# Patient Record
Sex: Female | Born: 1995 | Hispanic: Yes | Marital: Single | Smoking: Former smoker
Health system: Southern US, Community
[De-identification: ages and names within clinical notes are randomized; demographics above are authoritative.]

---
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
## 2015-09-28 ENCOUNTER — Encounter (HOSPITAL_COMMUNITY): Payer: Self-pay | Admitting: Emergency Medicine

## 2015-09-28 ENCOUNTER — Emergency Department (HOSPITAL_COMMUNITY)
Admission: EM | Admit: 2015-09-28 | Discharge: 2015-09-28 | Disposition: A | Discharge status: Home / Self Care | Payer: BLUE CROSS/BLUE SHIELD | Attending: Emergency Medicine | Admitting: Emergency Medicine

## 2015-09-28 DIAGNOSIS — Y92410 Unspecified street and highway as the place of occurrence of the external cause: Secondary | ICD-10-CM | POA: Diagnosis not present

## 2015-09-28 DIAGNOSIS — S0993XA Unspecified injury of face, initial encounter: Secondary | ICD-10-CM | POA: Diagnosis present

## 2015-09-28 DIAGNOSIS — Y998 Other external cause status: Secondary | ICD-10-CM | POA: Insufficient documentation

## 2015-09-28 DIAGNOSIS — T148 Other injury of unspecified body region: Secondary | ICD-10-CM | POA: Diagnosis not present

## 2015-09-28 DIAGNOSIS — Y9389 Activity, other specified: Secondary | ICD-10-CM | POA: Diagnosis not present

## 2015-09-28 DIAGNOSIS — F17200 Nicotine dependence, unspecified, uncomplicated: Secondary | ICD-10-CM | POA: Insufficient documentation

## 2015-09-28 DIAGNOSIS — S4990XA Unspecified injury of shoulder and upper arm, unspecified arm, initial encounter: Secondary | ICD-10-CM | POA: Diagnosis not present

## 2015-09-28 DIAGNOSIS — S0083XA Contusion of other part of head, initial encounter: Secondary | ICD-10-CM | POA: Diagnosis not present

## 2015-09-28 DIAGNOSIS — T148XXA Other injury of unspecified body region, initial encounter: Secondary | ICD-10-CM

## 2015-09-28 MED ORDER — IBUPROFEN 200 MG PO TABS
400.0000 mg | ORAL_TABLET | Freq: Once | ORAL | Status: AC
  Administered 2015-09-28: 400 mg via ORAL
  Filled 2015-09-28: qty 2
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
## 2016-04-01 DIAGNOSIS — R0789 Other chest pain: Secondary | ICD-10-CM | POA: Diagnosis not present

## 2016-04-01 DIAGNOSIS — F419 Anxiety disorder, unspecified: Secondary | ICD-10-CM | POA: Insufficient documentation

## 2016-04-01 DIAGNOSIS — F17200 Nicotine dependence, unspecified, uncomplicated: Secondary | ICD-10-CM | POA: Insufficient documentation

## 2016-04-02 ENCOUNTER — Encounter (HOSPITAL_COMMUNITY): Payer: Self-pay | Admitting: *Deleted

## 2016-04-02 ENCOUNTER — Emergency Department (HOSPITAL_COMMUNITY)
Admission: EM | Admit: 2016-04-02 | Discharge: 2016-04-02 | Disposition: A | Discharge status: Home / Self Care | Payer: BLUE CROSS/BLUE SHIELD | Attending: Emergency Medicine | Admitting: Emergency Medicine

## 2016-04-02 ENCOUNTER — Emergency Department (HOSPITAL_COMMUNITY): Payer: BLUE CROSS/BLUE SHIELD

## 2016-04-02 DIAGNOSIS — R0789 Other chest pain: Secondary | ICD-10-CM

## 2016-04-02 DIAGNOSIS — F419 Anxiety disorder, unspecified: Secondary | ICD-10-CM

## 2016-04-02 MED ORDER — KETOROLAC TROMETHAMINE 30 MG/ML IJ SOLN
30.0000 mg | Freq: Once | INTRAMUSCULAR | Status: AC
  Administered 2016-04-02: 30 mg via INTRAMUSCULAR
  Filled 2016-04-02: qty 1

## 2016-04-02 NOTE — ED Notes (Signed)
The pt is c/o a headache chest pain for 2 days  lmp 2 days ago

## 2016-04-02 NOTE — ED Provider Notes (Signed)
CSN: [id number]     Arrival date & time 04/01/16  2353 History   First MD Initiated Contact with Patient 04/02/16 0210     Chief Complaint  Patient presents with  . Chest Pain     (Consider location/radiation/quality/duration/timing/severity/associated sxs/prior Treatment) HPI Comments: This a 20 year old female who presents with left-sided chest pain, anxiety and momentary syncopal episode earlier today.  She states that she's had frequent episodes of left-sided chest pain for the past year.  She's been evaluated several times with no specific findings and told that she's had costochondritis and treated with anti-inflammatories.  Today she states the pain is different and more intense.  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  She became weak coworker said she did not look well.  She went to the freezer.  She felt further warm.  She sat down and she may have had a momentary syncopal episode after which she proceeded to finish her shift.  Denied any nausea, vomiting.  She states that she finished her menstrual cycle.  2 days ago.  She does have very heavy cycles and has been known to be anemic in the past.  She's never needed blood transfusions.  Denies any nausea, vomiting, diarrhea, dysuria, vaginal discharge.  She has not taken any medication for her discomfort.  She says that there is a lot of "things going on in her life."  And she feels stressed  Patient is a 20 y.o. female presenting with chest pain. The history is provided by the patient.  Chest Pain Pain location:  L chest Pain quality: dull   Pain radiates to:  Does not radiate Pain radiates to the back: no   Pain severity:  Moderate Onset quality:  Unable to specify Timing:  Intermittent Progression:  Worsening Chronicity:  Chronic Context: at rest and stress   Context: not breathing, not eating and not lifting   Relieved by:  Nothing Worsened by:  Nothing  tried Associated symptoms: headache and shortness of breath   Associated symptoms: no dizziness, no fever, no nausea, no palpitations and not vomiting     History reviewed. No pertinent past medical history. History reviewed. No pertinent past surgical history. No family history on file. Social History  Substance Use Topics  . Smoking status: Current Every Day Smoker  . Smokeless tobacco: None  . Alcohol Use: Yes   OB History    No data available     Review of Systems  Constitutional: Negative for fever.  Respiratory: Positive for shortness of breath. Negative for wheezing and stridor.   Cardiovascular: Positive for chest pain. Negative for palpitations and leg swelling.  Gastrointestinal: Negative for nausea and vomiting.  Genitourinary: Negative for dysuria.  Musculoskeletal: Negative for myalgias.  Neurological: Positive for headaches. Negative for dizziness.  All other systems reviewed and are negative.     Allergies  Review of patient's allergies indicates no known allergies.  Home Medications   Prior to Admission medications   Medication Sig Start Date End Date Taking? Authorizing Provider  ibuprofen (ADVIL,MOTRIN) 800 MG tablet Take 1 tablet (800 mg total) by mouth 3 (three) times daily. Patient not taking: Reported on 12/24/2014 10/11/14   Arby Barrette, MD   BP 104/58 mmHg  Pulse 62  Temp(Src) 98.4 F (36.9 C) (Oral)  Resp 16  Ht  (1.651 m)  Wt 57.834 kg  BMI 21.22 kg/m2  SpO2 100%  LMP 03/31/2016 Physical Exam  Constitutional: She appears well-developed and well-nourished.  HENT:  Head: Normocephalic.  Neck: Normal range of motion.  Cardiovascular: Normal rate and regular rhythm.   Pulmonary/Chest: Effort normal and breath sounds normal.  Abdominal: Soft. She exhibits no distension.  Musculoskeletal: Normal range of motion. She exhibits no edema.  Neurological: She is alert.  Skin: Skin is warm.  Psychiatric: She has a normal mood and affect.   Nursing note and vitals reviewed.   ED Course  Procedures (including critical care time) Labs Review Labs Reviewed - No data to display  Imaging Review Dg Chest 2 View  04/02/2016  CLINICAL DATA:  Shortness of breath and chest pain left side for a few months, worse over 24 hours. EXAM: CHEST  2 VIEW COMPARISON:  12/24/2014 FINDINGS: The heart size and mediastinal contours are within normal limits. Both lungs are clear. The visualized skeletal structures are unremarkable. IMPRESSION: No active cardiopulmonary disease. Electronically Signed   By: Burman NievesWilliam  Stevens M.D.   On: 04/02/2016 01:13   I have personally reviewed and evaluated these images and lab results as part of my medical decision-making.   EKG Interpretation   Date/Time:  Saturday April 02 2016 00:21:52 EDT Ventricular Rate:  94 PR Interval:  112 QRS Duration: 80 QT Interval:  352 QTC Calculation: 440 R Axis:   76 Text Interpretation:  Normal sinus rhythm Interpretation limited secondary  to artifact no changes seen Confirmed by Erroll Lunani, Adeleke Ayokunle [phone]([id number]) on  04/02/2016 2:33:40 AM      MDM   Final diagnoses:  Anxiety  Left-sided chest wall pain        Earley FavorGail Kaenan Jake, NP 04/02/16 [id number]  Tomasita CrumbleAdeleke Oni, MD 04/02/16 [phone]

## 2016-04-02 NOTE — Discharge Instructions (Signed)
Chest Wall Pain °Chest wall pain is pain in or around the bones and muscles of your chest. Sometimes, an injury causes this pain. Sometimes, the cause may not be known. This pain may take several weeks or longer to get better. °HOME CARE °Pay attention to any changes in your symptoms. Take these actions to help with your pain: °· Rest as told by your doctor. °· Avoid activities that cause pain. Try not to use your chest, belly (abdominal), or side muscles to lift heavy things. °· If directed, apply ice to the painful area: °¨ Put ice in a plastic bag. °¨ Place a towel between your skin and the bag. °¨ Leave the ice on for 20 minutes, 2-3 times per day. °· Take over-the-counter and prescription medicines only as told by your doctor. °· Do not use tobacco products, including cigarettes, chewing tobacco, and e-cigarettes. If you need help quitting, ask your doctor. °· Keep all follow-up visits as told by your doctor. This is important. °GET HELP IF: °· You have a fever. °· Your chest pain gets worse. °· You have new symptoms. °GET HELP RIGHT AWAY IF: °· You feel sick to your stomach (nauseous) or you throw up (vomit). °· You feel sweaty or light-headed. °· You have a cough with phlegm (sputum) or you cough up blood. °· You are short of breath. °  °This information is not intended to replace advice given to you by your health care provider. Make sure you discuss any questions you have with your health care provider. °  °Document Released: 02/22/2008 Document Revised: 05/27/2015 Document Reviewed: 12/01/2014 °Elsevier Interactive Patient Education ©2016 Elsevier Inc. ° °

## 2016-05-22 ENCOUNTER — Emergency Department (HOSPITAL_COMMUNITY)
Admission: EM | Admit: 2016-05-22 | Discharge: 2016-05-22 | Disposition: A | Discharge status: Home / Self Care | Payer: BLUE CROSS/BLUE SHIELD | Attending: Emergency Medicine | Admitting: Emergency Medicine

## 2016-05-22 ENCOUNTER — Emergency Department (HOSPITAL_COMMUNITY): Payer: BLUE CROSS/BLUE SHIELD

## 2016-05-22 ENCOUNTER — Encounter (HOSPITAL_COMMUNITY): Payer: Self-pay

## 2016-05-22 DIAGNOSIS — M79645 Pain in left finger(s): Secondary | ICD-10-CM | POA: Diagnosis present

## 2016-05-22 DIAGNOSIS — Z79899 Other long term (current) drug therapy: Secondary | ICD-10-CM | POA: Insufficient documentation

## 2016-05-22 DIAGNOSIS — L03019 Cellulitis of unspecified finger: Secondary | ICD-10-CM

## 2016-05-22 DIAGNOSIS — F17200 Nicotine dependence, unspecified, uncomplicated: Secondary | ICD-10-CM | POA: Diagnosis not present

## 2016-05-22 DIAGNOSIS — Z23 Encounter for immunization: Secondary | ICD-10-CM | POA: Insufficient documentation

## 2016-05-22 DIAGNOSIS — L03012 Cellulitis of left finger: Secondary | ICD-10-CM | POA: Diagnosis not present

## 2016-05-22 MED ORDER — BUPIVACAINE HCL (PF) 0.5 % IJ SOLN
10.0000 mL | Freq: Once | INTRAMUSCULAR | Status: AC
  Administered 2016-05-22: 10 mL
  Filled 2016-05-22: qty 10

## 2016-05-22 MED ORDER — TETANUS-DIPHTH-ACELL PERTUSSIS 5-2.5-18.5 LF-MCG/0.5 IM SUSP
0.5000 mL | Freq: Once | INTRAMUSCULAR | Status: AC
  Administered 2016-05-22: 0.5 mL via INTRAMUSCULAR
  Filled 2016-05-22: qty 0.5

## 2016-05-22 MED ORDER — HYDROCODONE-ACETAMINOPHEN 5-325 MG PO TABS: 1.0000 | ORAL_TABLET | Freq: Four times a day (QID) | ORAL | 0 refills | Status: DC | PRN

## 2016-05-22 MED ORDER — LIDOCAINE HCL (PF) 1 % IJ SOLN
5.0000 mL | Freq: Once | INTRAMUSCULAR | Status: AC
  Administered 2016-05-22: 5 mL
  Filled 2016-05-22: qty 5

## 2016-05-22 MED ORDER — NAPROXEN 500 MG PO TABS: 500.0000 mg | ORAL_TABLET | Freq: Two times a day (BID) | ORAL | 0 refills | Status: DC

## 2016-05-22 MED ORDER — CEPHALEXIN 500 MG PO CAPS: 500.0000 mg | ORAL_CAPSULE | Freq: Four times a day (QID) | ORAL | 0 refills | Status: DC
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
## 2016-05-22 NOTE — Discharge Instructions (Signed)
You have been seen today for thumb pain. Your imaging showed no abnormalities. Her pain is likely due to infection under the skin, called a felon. It may stem from a lesser infection called a paronychia, which originates as an infection underneath the nail cuticle. These infections can arise from the use of artificial nails as well as from injuries to the cuticle.   Please take all of your antibiotics until finished!   You may develop abdominal discomfort or diarrhea from the antibiotic.  You may help offset this with probiotics which you can buy or get in yogurt. Do not eat or take the probiotics until 2 hours after your antibiotic.   Remove the bandage after 24 hours. You must wait at least 8 hours after the wound repair to wash the wound. Clean the wound and surrounding area gently with tap water and mild soap. Rinse well and blot dry. Do not scrub the wound, as this may cause the wound edges to come apart. You may shower, but avoid submerging the wound, such as with a bath or swimming. Clean the wound daily to prevent infection. Reapplication of a topical antibiotic ointment, such as Neosporin, will decrease scab formation and reduce any scarring. You may use Tylenol, naproxen, ibuprofen for pain.  Return to the ED in 3 days for suture removal.  Return to the ED sooner should signs of infection arise, such as spreading redness, puffiness/swelling, pus draining from the wound, severe increase in pain, or any other major issues.

## 2016-05-22 NOTE — ED Provider Notes (Signed)
[facility]-EMER[facility]Y DEPT Provider Note   CSN: [id number] Arrival date & time: 05/22/16  0940  By signing my name below, I, Javier Docker, attest that this documentation has been prepared under the direction and in the presence of Yarethzy Croak, PA-C. Electronically Signed: Javier Docker, ER Scribe. 04/30/2016. 10:11 AM.   History   Chief Complaint Chief Complaint  Patient presents with  . thumb pain   The history is provided by the patient. No language interpreter was used.     HPI Comments: Angeleen Horney is a 20 y.o. female who presents to the Emergency Department complaining of left thumb swelling and pain for the last three days. She is not aware of an initial injury. Has not taken anything for the pain prior to arrival. She states she did have acrylic nails placed about 2 weeks ago. She denies fever/chills, neuro deficits, or any other complaints.    History reviewed. No pertinent past medical history.  There are no active problems to display for this patient.   History reviewed. No pertinent surgical history.  OB History    No data available       Home Medications    Prior to Admission medications   Medication Sig Start Date End Date Taking? Authorizing Provider  cephALEXin (KEFLEX) 500 MG capsule Take 1 capsule (500 mg total) by mouth 4 (four) times daily. 05/22/16   Howie Rufus C Barrie Wale, PA-C  HYDROcodone-acetaminophen (NORCO/VICODIN) 5-325 MG tablet Take 1 tablet by mouth every 6 (six) hours as needed. 05/22/16   Leslee Suire C Johnnay Pleitez, PA-C  ibuprofen (ADVIL,MOTRIN) 800 MG tablet Take 1 tablet (800 mg total) by mouth 3 (three) times daily. Patient not taking: Reported on 12/24/2014 10/11/14   Arby Barrette, MD  naproxen (NAPROSYN) 500 MG tablet Take 1 tablet (500 mg total) by mouth 2 (two) times daily. 05/22/16   Anselm Pancoast, PA-C    Family History No family history on file.  Social History Social History  Substance Use Topics  . Smoking status: Current Every Day Smoker  . Smokeless  tobacco: Never Used  . Alcohol use Yes     Allergies   Review of patient's allergies indicates no known allergies.   Review of Systems Review of Systems  Constitutional: Negative for chills and fever.  Skin: Positive for color change. Negative for wound.  Neurological: Negative for weakness and numbness.     Physical Exam Updated Vital Signs BP 113/76   Pulse 95   Temp 98.7 F (37.1 C) (Oral)   Resp 18   SpO2 99%   Physical Exam  Constitutional: She appears well-developed and well-nourished. No distress.  HENT:  Head: Normocephalic and atraumatic.  Eyes: Conjunctivae are normal.  Neck: Neck supple.  Cardiovascular: Normal rate and regular rhythm.   Pulmonary/Chest: Effort normal.  Musculoskeletal:  Swelling, redness, and tenderness to the left thumb, especially proximal to the nail bed. ROM is intact.  Neurological: She is alert.  No sensory deficits. Strength 5 out of 5 in the left hand.  Skin: Skin is warm and dry. She is not diaphoretic.  Psychiatric: She has a normal mood and affect. Her behavior is normal.  Nursing note and vitals reviewed.    ED Treatments / Results  DIAGNOSTIC STUDIES: Oxygen Saturation is 99% on RA, normal by my interpretation.    COORDINATION OF CARE: 10:12 AM Discussed treatment plan with pt I&D and pt agreed to plan.   Labs (all labs ordered are listed, but only abnormal results are displayed)  Labs Reviewed - No data to display  EKG  EKG Interpretation None       Radiology Dg Finger Thumb Left  Result Date: 05/22/2016 CLINICAL DATA:  20 year old female with a history of left thumb edema EXAM: LEFT THUMB 2+V COMPARISON:  None. FINDINGS: No acute fracture identified. No radiopaque foreign body. Joints appear congruent. Questionable soft tissue swelling on the dorsal aspect of the distal thumb. There is a rounded small lucency on the lateral view of the thumb, dorsal soft tissues, subjacent to the fabricated finger nail. This  is not visualized on the other views. IMPRESSION: Negative for acute bony abnormality. Questionable soft tissue swelling on the dorsal soft tissues of the thumb. The lateral view demonstrates questionable small focus of gas underneath the fabricated fingernail. Uncertain if this would be related to application of the fabricated fingernail, or alternatively an infection. This should be amenable to direct inspection. Signed, Yvone NeuJaime S. Loreta AveWagner, DO [facility] Electronically Signed   By: Gilmer MorJaime  Wagner D.O.   On: 05/22/2016 10:57    Procedures .Marland Kitchen.Incision and Drainage Date/Time: 05/22/2016 11:55 AM Performed by: Anselm PancoastJOY, Amaad Byers C Authorized by: Anselm PancoastJOY, Desaray Marschner C   Consent:    Consent obtained:  Verbal   Consent given by:  Patient   Risks discussed:  Bleeding, infection, incomplete drainage and pain   Alternatives discussed:  No treatment, delayed treatment and alternative treatment Location:    Type:  Abscess   Location:  Upper extremity   Upper extremity location:  Finger   Finger location:  L thumb Pre-procedure details:    Skin preparation:  Betadine Anesthesia (see MAR for exact dosages):    Anesthesia method:  Nerve block   Block location:  Digital   Block needle gauge:  27 G   Block anesthetic:  Lidocaine 1% w/o epi and bupivacaine 0.5% w/o epi   Block injection procedure:  Anatomic landmarks identified, anatomic landmarks palpated, introduced needle, negative aspiration for blood and incremental injection   Block outcome:  Incomplete block Procedure type:    Complexity:  Simple Procedure details:    Incision types:  Single straight   Incision depth:  Subungual   Scalpel blade:  11   Wound management:  Irrigated with saline   Drainage:  Bloody and purulent   Drainage amount:  Moderate   Wound treatment:  Wound left open   Packing materials:  None Post-procedure details:    Patient tolerance of procedure:  Tolerated with  difficulty .Nail Removal Date/Time: 05/22/2016 12:55 PM Performed by: Anselm PancoastJOY, Raylynn Hersh C Authorized by: Harolyn RutherfordJOY, Verl Kitson C   Consent:    Consent obtained:  Verbal   Consent given by:  Patient   Risks discussed:  Bleeding, incomplete removal, infection, pain and permanent nail deformity   Alternatives discussed:  No treatment, alternative treatment and delayed treatment Location:    Hand:  L thumb Pre-procedure details:    Skin preparation:  Betadine   Preparation: Patient was prepped and draped in the usual sterile fashion   Anesthesia (see MAR for exact dosages):    Anesthesia method:  Nerve block   Block location:  Digital   Block needle gauge:  27 G   Block anesthetic:  Bupivacaine 0.5% w/o epi and lidocaine 1% w/o epi   Block injection procedure:  Anatomic landmarks identified, anatomic landmarks palpated, introduced needle, negative aspiration for blood and incremental injection   Block outcome:  Incomplete block Nail Removal:    Nail removed:  Complete  Removed nail replaced and anchored: yes     Stented with:  3-0 silk Post-procedure details:    Dressing:  4x4 sterile gauze and splint   Patient tolerance of procedure:  Tolerated with difficulty .Nerve Block Date/Time: 05/22/2016 11:30 AM Performed by: Anselm Pancoast Authorized by: Anselm Pancoast   Consent:    Consent obtained:  Verbal   Consent given by:  Patient   Risks discussed:  Allergic reaction, infection, nerve damage, swelling, unsuccessful block and pain   Alternatives discussed:  No treatment Indications:    Indications:  Procedural anesthesia Location:    Body area:  Upper extremity   Upper extremity nerve blocked: Digital.   Laterality:  Left Pre-procedure details:    Skin preparation:  2% chlorhexidine   Preparation: Patient was prepped and draped in usual sterile fashion   Procedure details (see MAR for exact dosages):    Block needle gauge:  27 G   Anesthetic injected:  Bupivacaine 0.5% w/o epi and lidocaine 1%  w/o epi   Injection procedure:  Anatomic landmarks identified, anatomic landmarks palpated, incremental injection, introduced needle and negative aspiration for blood Post-procedure details:    Outcome:  Anesthesia achieved   Patient tolerance of procedure:  Tolerated well, no immediate complications   (including critical care time)  Medications Ordered in ED Medications  Tdap (BOOSTRIX) injection 0.5 mL (0.5 mLs Intramuscular Given 05/22/16 1023)  bupivacaine (MARCAINE) 0.5 % injection 10 mL (10 mLs Infiltration Given by Other 05/22/16 1022)  lidocaine (PF) (XYLOCAINE) 1 % injection 5 mL (5 mLs Infiltration Given by Other 05/22/16 1022)     Initial Impression / Assessment and Plan / ED Course  I have reviewed the triage vital signs and the nursing notes.  Pertinent labs & imaging results that were available during my care of the patient were reviewed by me and considered in my medical decision making (see chart for details).  Clinical Course    Sierah Lacewell presents with paronychia that has likely progress to a felon. No signs of spreading infection into the hand. No signs of systemic infection. The initial I&D failed to produce access to the source of infection. The source of the infection was found to be subungual. The nail had to be removed to facilitate proper cleaning. Underlying nail matrix left intact. Nail was slightly reattached with silk suture. Patient to return in 3 days for wound check and suture removal. She will need to have an appropriate bandage placed at that time to continue to protect the nail matrix. The patient was given instructions for home care as well as return precautions. Patient voices understanding of these instructions, accepts the plan, and is comfortable with discharge.      Final Clinical Impressions(s) / ED Diagnoses   Final diagnoses:  Felon of finger    New Prescriptions Discharge Medication List as of 05/22/2016 12:57 PM    START taking these  medications   Details  cephALEXin (KEFLEX) 500 MG capsule Take 1 capsule (500 mg total) by mouth 4 (four) times daily., Starting Sun 05/22/2016, Print    HYDROcodone-acetaminophen (NORCO/VICODIN) 5-325 MG tablet Take 1 tablet by mouth every 6 (six) hours as needed., Starting Sun 05/22/2016, Print    naproxen (NAPROSYN) 500 MG tablet Take 1 tablet (500 mg total) by mouth 2 (two) times daily., Starting Sun 05/22/2016, Print          I personally performed the services described in this documentation, which was scribed in my presence. The recorded  information has been reviewed and is accurate.    Anselm Pancoast, PA-C 05/22/16 1352    Doug Sou, MD 05/22/16 1640

## 2016-05-26 ENCOUNTER — Emergency Department (HOSPITAL_COMMUNITY)
Admission: EM | Admit: 2016-05-26 | Discharge: 2016-05-27 | Disposition: A | Discharge status: Home / Self Care | Payer: BLUE CROSS/BLUE SHIELD | Attending: Emergency Medicine | Admitting: Emergency Medicine

## 2016-05-26 ENCOUNTER — Encounter (HOSPITAL_COMMUNITY): Payer: Self-pay | Admitting: Emergency Medicine

## 2016-05-26 DIAGNOSIS — Z87891 Personal history of nicotine dependence: Secondary | ICD-10-CM | POA: Insufficient documentation

## 2016-05-26 DIAGNOSIS — Z4801 Encounter for change or removal of surgical wound dressing: Secondary | ICD-10-CM | POA: Diagnosis not present

## 2016-05-26 DIAGNOSIS — Z4802 Encounter for removal of sutures: Secondary | ICD-10-CM | POA: Insufficient documentation

## 2016-05-26 DIAGNOSIS — Z5189 Encounter for other specified aftercare: Secondary | ICD-10-CM

## 2016-05-26 NOTE — ED Provider Notes (Signed)
[facility] Provider Note   CSN: [id number] Arrival date & time: 05/26/16  2052  By signing my name below, I, Emmanuella Mensah, attest that this documentation has been prepared under the direction and in the presence of Kerrie BuffaloHope Neese, NP. Electronically Signed: Angelene GiovanniEmmanuella Mensah, ED Scribe. 05/26/16. 12:05 AM.    History   Chief Complaint Chief Complaint  Patient presents with  . Suture / Staple Removal   HPI Comments: Sandra Vincent is a 20 y.o. female who presents to the Emergency Department requesting suture removal from left thumb. Pt was seen here on 05/22/16 for persistent left thumb pain and swelling where she had normal thumb x-ray and diagnosed with a Paronychia. She received an I&D with moderate amount of bloody and purulent drainage. She then received a nail removal with a nerve block. Pt was discharged with Keflex and her tetanus vaccine was updated at that time as well. She reports that she is here today because she was advised to follow up within 3 days for suture removal. No fever, chills, or generalized rash.   The history is provided by the patient. No language interpreter was used.    History reviewed. No pertinent past medical history.  There are no active problems to display for this patient.   History reviewed. No pertinent surgical history.  OB History    No data available       Home Medications    Prior to Admission medications   Medication Sig Start Date End Date Taking? Authorizing Provider  cephALEXin (KEFLEX) 500 MG capsule Take 1 capsule (500 mg total) by mouth 4 (four) times daily. 05/22/16   Shawn C Joy, PA-C  HYDROcodone-acetaminophen (NORCO/VICODIN) 5-325 MG tablet Take 1 tablet by mouth every 6 (six) hours as needed. 05/22/16   Shawn C Joy, PA-C  ibuprofen (ADVIL,MOTRIN) 800 MG tablet Take 1 tablet (800 mg total) by mouth 3 (three) times daily. Patient not taking: Reported on 12/24/2014 10/11/14   Arby BarretteMarcy Pfeiffer, MD  naproxen (NAPROSYN) 500 MG  tablet Take 1 tablet (500 mg total) by mouth 2 (two) times daily. 05/22/16   Anselm PancoastShawn C Joy, PA-C    Family History History reviewed. No pertinent family history.  Social History Social History  Substance Use Topics  . Smoking status: Former Games developermoker  . Smokeless tobacco: Never Used  . Alcohol use No     Allergies   Review of patient's allergies indicates no known allergies.   Review of Systems Review of Systems  Constitutional: Negative for chills and fever.  Skin: Positive for wound. Negative for rash.     Physical Exam Updated Vital Signs BP 104/75 (BP Location: Right Arm)   Pulse 72   Temp 98.3 F (36.8 C) (Oral)   Resp 20   Ht 5\' 5"  (1.651 m)   Wt 58.1 kg   LMP 04/30/2016   SpO2 100%   BMI 21.30 kg/m   Physical Exam  Constitutional: She is oriented to person, place, and time. She appears well-developed and well-nourished. No distress.  HENT:  Head: Normocephalic and atraumatic.  Eyes: EOM are normal.  Neck: Normal range of motion.  Cardiovascular: Bradycardia present.   Pulmonary/Chest: Effort normal.  Musculoskeletal: Normal range of motion.  Left thumb with sutures in place. No red streaking and thumb appears to be healing well.   Neurological: She is alert and oriented to person, place, and time.  Skin: Skin is warm and dry.  Psychiatric: She has a normal mood and affect. Her behavior is normal.  Nursing note and vitals reviewed.    ED Treatments / Results  DIAGNOSTIC STUDIES: Oxygen Saturation is 100% on RA, normal by my interpretation.    COORDINATION OF CARE: 11:51 PM- Pt advised of plan for treatment and pt agrees. Pt will receive suture removal.    Procedures .Suture Removal Date/Time: 05/26/2016 11:52 PM Performed by: Janne Napoleon Authorized by: Janne Napoleon   Consent:    Consent obtained:  Verbal   Consent given by:  Patient   Risks discussed:  Pain Location:    Location:  Upper extremity   Upper extremity location:  Hand   Hand  location:  L thumb Procedure details:    Wound appearance:  Tender and clean Post-procedure details:    Post-removal:  Antibiotic ointment applied and dressing applied (splint)   Patient tolerance of procedure:  Tolerated well, no immediate complications    (including critical care time)  Medications Ordered in ED Medications  bacitracin ointment (1 application Topical Given 05/27/16 0110)     Initial Impression / Assessment and Plan / ED Course  Kerrie Buffalo, NP has reviewed the triage vital signs and the nursing notes.   Clinical Course    Suture removal   Pt to ER for suture removal and wound check as above. Procedure tolerated well. Vitals normal. Scar minimization and discussion on possibility of defect in new nail & return precautions given at dc. Splint re applied.   Final Clinical Impressions(s) / ED Diagnoses  20 y.o. female with follow up visit stable for d/c without fever or red streaking after I&D of felon of left thumb. She will return as needed for any problems.   Final diagnoses:  Visit for suture removal  Encounter for wound re-check    New Prescriptions Discharge Medication List as of 05/27/2016 12:51 AM     *I personally performed the services described in this documentation, which was scribed in my presence. The recorded information has been reviewed and is accurate.    [address] [place], NP 05/27/16 0330    Shaune Pollack, MD 05/27/16 [phone]

## 2016-05-26 NOTE — ED Triage Notes (Signed)
Pt here for removal of sutured to left thumb. No signs of infection.

## 2016-05-27 MED ORDER — BACITRACIN ZINC 500 UNIT/GM EX OINT
TOPICAL_OINTMENT | Freq: Two times a day (BID) | CUTANEOUS | Status: DC
  Administered 2016-05-27: 1 via TOPICAL

## 2016-06-29 ENCOUNTER — Encounter (HOSPITAL_COMMUNITY): Payer: Self-pay

## 2016-06-29 ENCOUNTER — Inpatient Hospital Stay (HOSPITAL_COMMUNITY)
Admission: AD | Admit: 2016-06-29 | Discharge: 2016-06-29 | Disposition: A | Discharge status: Home / Self Care | Payer: BLUE CROSS/BLUE SHIELD | Source: Ambulatory Visit | Attending: Family Medicine | Admitting: Family Medicine

## 2016-06-29 DIAGNOSIS — Z30011 Encounter for initial prescription of contraceptive pills: Secondary | ICD-10-CM | POA: Insufficient documentation

## 2016-06-29 LAB — URINALYSIS, ROUTINE W REFLEX MICROSCOPIC
BILIRUBIN URINE: NEGATIVE
Glucose, UA: NEGATIVE mg/dL
HGB URINE DIPSTICK: NEGATIVE
KETONES UR: NEGATIVE mg/dL
Leukocytes, UA: NEGATIVE
NITRITE: NEGATIVE
Protein, ur: NEGATIVE mg/dL
Specific Gravity, Urine: 1.02 (ref 1.005–1.030)
pH: 8.5 — ABNORMAL HIGH (ref 5.0–8.0)

## 2016-06-29 LAB — POCT PREGNANCY, URINE: PREG TEST UR: NEGATIVE

## 2016-06-29 MED ORDER — NORGESTIMATE-ETH ESTRADIOL 0.25-35 MG-MCG PO TABS: 1.0000 | ORAL_TABLET | Freq: Every day | ORAL | 2 refills | Status: DC

## 2016-06-29 NOTE — MAU Provider Note (Signed)
S:  Sandra Vincent is a 20 y.o. female here for checkup. She wants to make sure all is well with [name]  She does not want to be pregnancy now, however wants to know if she is healthy enough to get pregnant. She desires birth control pills and possible a nexplanon/IUD.    O:  GENERAL: Well-developed, well-nourished female in no acute distress.  LUNGS: Effort normal SKIN: Warm, dry and without erythema PSYCH: Normal mood and affect  Vitals:   06/29/16 1511 06/29/16 1538  BP: (!) 102/50 102/62  Pulse: 69 68  Resp: 16 16  Temp: 98.2 F (36.8 C) 97.9 F (36.6 C)  TempSrc: Oral Oral    Results for orders placed or performed during the hospital encounter of 06/29/16 (from the past 48 hour(s))  Urinalysis, Routine w reflex microscopic ([facility])     Status: Abnormal   Collection Time: 06/29/16  3:00 PM  Result Value Ref Range   Color, Urine YELLOW YELLOW   APPearance CLEAR CLEAR   Specific Gravity, Urine 1.020 1.005 - 1.030   pH 8.5 (H) 5.0 - 8.0   Glucose, UA NEGATIVE NEGATIVE mg/dL   Hgb urine dipstick NEGATIVE NEGATIVE   Bilirubin Urine NEGATIVE NEGATIVE   Ketones, ur NEGATIVE NEGATIVE mg/dL   Protein, ur NEGATIVE NEGATIVE mg/dL   Nitrite NEGATIVE NEGATIVE   Leukocytes, UA NEGATIVE NEGATIVE    Comment: MICROSCOPIC NOT DONE ON URINES WITH NEGATIVE PROTEIN, BLOOD, LEUKOCYTES, NITRITE, OR GLUCOSE <1000 mg/dL.  Pregnancy, urine POC     Status: None   Collection Time: 06/29/16  3:08 PM  Result Value Ref Range   Preg Test, Ur NEGATIVE NEGATIVE    Comment:        THE SENSITIVITY OF THIS METHODOLOGY IS >24 mIU/mL     MDM:  UA UPT    A:  1. Encounter for initial prescription of contraceptive pills     P:  Discharge home in stable condition Rx: Sprintec [facility]ex[facility]on [facility] Return to MAU as needed If symptoms worsen    Duane LopeJennifer I Rasch, NP 06/29/2016 5:28 PM

## 2016-06-29 NOTE — MAU Note (Signed)
Patient states that she bleeds between menstrual cycles but denies bleeding right now. Patient states that she had sex with her boyfriend in august but did not get pregnant and she bled so she wanted to come get checked out to make sure everything was ok.

## 2016-06-29 NOTE — Discharge Instructions (Signed)
Contraception Choices Contraception (birth control) is the use of any methods or devices to prevent pregnancy. Below are some methods to help avoid pregnancy. HORMONAL METHODS   Contraceptive implant. This is a thin, plastic tube containing progesterone hormone. It does not contain estrogen hormone. Your health care provider inserts the tube in the inner part of the upper arm. The tube can remain in place for up to 3 years. After 3 years, the implant must be removed. The implant prevents the ovaries from releasing an egg (ovulation), thickens the cervical mucus to prevent sperm from entering the uterus, and thins the lining of the inside of the uterus.  Progesterone-only injections. These injections are given every 3 months by your health care provider to prevent pregnancy. This synthetic progesterone hormone stops the ovaries from releasing eggs. It also thickens cervical mucus and changes the uterine lining. This makes it harder for sperm to survive in the uterus.  Birth control pills. These pills contain estrogen and progesterone hormone. They work by preventing the ovaries from releasing eggs (ovulation). They also cause the cervical mucus to thicken, preventing the sperm from entering the uterus. Birth control pills are prescribed by a health care provider.Birth control pills can also be used to treat heavy periods.  Minipill. This type of birth control pill contains only the progesterone hormone. They are taken every day of each month and must be prescribed by your health care provider.  Birth control patch. The patch contains hormones similar to those in birth control pills. It must be changed once a week and is prescribed by a health care provider.  Vaginal ring. The ring contains hormones similar to those in birth control pills. It is left in the vagina for 3 weeks, removed for 1 week, and then a new one is put back in place. The patient must be comfortable inserting and removing the ring  from the vagina.A health care provider's prescription is necessary.  Emergency contraception. Emergency contraceptives prevent pregnancy after unprotected sexual intercourse. This pill can be taken right after sex or up to 5 days after unprotected sex. It is most effective the sooner you take the pills after having sexual intercourse. Most emergency contraceptive pills are available without a prescription. Check with your pharmacist. Do not use emergency contraception as your only form of birth control. BARRIER METHODS   Female condom. This is a thin sheath (latex or rubber) that is worn over the penis during sexual intercourse. It can be used with spermicide to increase effectiveness.  Female condom. This is a soft, loose-fitting sheath that is put into the vagina before sexual intercourse.  Diaphragm. This is a soft, latex, dome-shaped barrier that must be fitted by a health care provider. It is inserted into the vagina, along with a spermicidal jelly. It is inserted before intercourse. The diaphragm should be left in the vagina for 6 to 8 hours after intercourse.  Cervical cap. This is a round, soft, latex or plastic cup that fits over the cervix and must be fitted by a health care provider. The cap can be left in place for up to 48 hours after intercourse.  Sponge. This is a soft, circular piece of polyurethane foam. The sponge has spermicide in it. It is inserted into the vagina after wetting it and before sexual intercourse.  Spermicides. These are chemicals that kill or block sperm from entering the cervix and uterus. They come in the form of creams, jellies, suppositories, foam, or tablets. They do not require a   prescription. They are inserted into the vagina with an applicator before having sexual intercourse. The process must be repeated every time you have sexual intercourse. INTRAUTERINE CONTRACEPTION  Intrauterine device (IUD). This is a T-shaped device that is put in a woman's uterus  during a menstrual period to prevent pregnancy. There are 2 types:  Copper IUD. This type of IUD is wrapped in copper wire and is placed inside the uterus. Copper makes the uterus and fallopian tubes produce a fluid that kills sperm. It can stay in place for 10 years.  Hormone IUD. This type of IUD contains the hormone progestin (synthetic progesterone). The hormone thickens the cervical mucus and prevents sperm from entering the uterus, and it also thins the uterine lining to prevent implantation of a fertilized egg. The hormone can weaken or kill the sperm that get into the uterus. It can stay in place for 3-5 years, depending on which type of IUD is used. PERMANENT METHODS OF CONTRACEPTION  Female tubal ligation. This is when the woman's fallopian tubes are surgically sealed, tied, or blocked to prevent the egg from traveling to the uterus.  Hysteroscopic sterilization. This involves placing a small coil or insert into each fallopian tube. Your doctor uses a technique called hysteroscopy to do the procedure. The device causes scar tissue to form. This results in permanent blockage of the fallopian tubes, so the sperm cannot fertilize the egg. It takes about 3 months after the procedure for the tubes to become blocked. You must use another form of birth control for these 3 months.  Female sterilization. This is when the female has the tubes that carry sperm tied off (vasectomy).This blocks sperm from entering the vagina during sexual intercourse. After the procedure, the man can still ejaculate fluid (semen). NATURAL PLANNING METHODS  Natural family planning. This is not having sexual intercourse or using a barrier method (condom, diaphragm, cervical cap) on days the woman could become pregnant.  Calendar method. This is keeping track of the length of each menstrual cycle and identifying when you are fertile.  Ovulation method. This is avoiding sexual intercourse during ovulation.  Symptothermal  method. This is avoiding sexual intercourse during ovulation, using a thermometer and ovulation symptoms.  Post-ovulation method. This is timing sexual intercourse after you have ovulated. Regardless of which type or method of contraception you choose, it is important that you use condoms to protect against the transmission of sexually transmitted infections (STIs). Talk with your health care provider about which form of contraception is most appropriate for you.   This information is not intended to replace advice given to you by your health care provider. Make sure you discuss any questions you have with your health care provider.   Document Released: 09/05/2005 Document Revised: 09/10/2013 Document Reviewed: 02/28/2013 Elsevier Interactive Patient Education 2016 [employer]  

## 2016-07-07 ENCOUNTER — Ambulatory Visit: Payer: BLUE CROSS/BLUE SHIELD | Admitting: Pediatrics

## 2016-09-19 ENCOUNTER — Encounter (HOSPITAL_COMMUNITY): Payer: Self-pay | Admitting: Emergency Medicine

## 2016-09-19 ENCOUNTER — Ambulatory Visit (HOSPITAL_COMMUNITY)
Admission: EM | Admit: 2016-09-19 | Discharge: 2016-09-19 | Disposition: A | Discharge status: Home / Self Care | Payer: BLUE CROSS/BLUE SHIELD | Attending: Family Medicine | Admitting: Family Medicine

## 2016-09-19 DIAGNOSIS — J069 Acute upper respiratory infection, unspecified: Secondary | ICD-10-CM

## 2016-09-19 DIAGNOSIS — B9789 Other viral agents as the cause of diseases classified elsewhere: Secondary | ICD-10-CM

## 2016-09-19 MED ORDER — BENZONATATE 100 MG PO CAPS: 100.0000 mg | ORAL_CAPSULE | Freq: Three times a day (TID) | ORAL | 0 refills | Status: DC

## 2016-09-19 MED ORDER — IPRATROPIUM BROMIDE 0.06 % NA SOLN: 2.0000 | Freq: Four times a day (QID) | NASAL | 12 refills | Status: DC

## 2016-09-19 NOTE — ED Triage Notes (Signed)
PT reports cough, congestion, runny nose, and sore throat for 3 days.

## 2016-09-19 NOTE — ED Provider Notes (Signed)
CSN: [id number]     Arrival date & time 09/19/16  1552 History   First MD Initiated Contact with Patient 09/19/16 1710     Chief Complaint  Patient presents with  . URI   (Consider location/radiation/quality/duration/timing/severity/associated sxs/prior Treatment) Patient c/o uri sx's for over 3 days.   The history is provided by the patient.  URI  Presenting symptoms: congestion, cough, fatigue, rhinorrhea and sore throat   Severity:  Moderate Duration:  3 days Timing:  Constant Progression:  Worsening Chronicity:  New Relieved by:  Nothing Worsened by:  Nothing Ineffective treatments:  None tried Associated symptoms: sinus pain     History reviewed. No pertinent past medical history. History reviewed. No pertinent surgical history. No family history on file. Social History  Substance Use Topics  . Smoking status: Former Games developermoker  . Smokeless tobacco: Never Used  . Alcohol use No   OB History    No data available     Review of Systems  Constitutional: Positive for fatigue.  HENT: Positive for congestion, postnasal drip, rhinorrhea, sinus pain and sore throat.   Eyes: Negative.   Respiratory: Positive for cough.   Cardiovascular: Negative.   Gastrointestinal: Negative.   Endocrine: Negative.   Genitourinary: Negative.   Allergic/Immunologic: Negative.   Neurological: Negative.   Hematological: Negative.   Psychiatric/Behavioral: Negative.     Allergies  Patient has no known allergies.  Home Medications   Prior to Admission medications   Medication Sig Start Date End Date Taking? Authorizing Provider  benzonatate (TESSALON) 100 MG capsule Take 1 capsule (100 mg total) by mouth every 8 (eight) hours. 09/19/16   Deatra CanterWilliam J Ashtin Melichar, FNP  ipratropium (ATROVENT) 0.06 % nasal spray Place 2 sprays into both nostrils 4 (four) times daily. 09/19/16   Deatra CanterWilliam J Kayley Zeiders, FNP  norgestimate-ethinyl estradiol (ORTHO-CYCLEN,SPRINTEC,PREVIFEM) 0.25-35 MG-MCG tablet Take 1 tablet by  mouth daily. 06/29/16   Duane LopeJennifer I Rasch, NP   Meds Ordered and Administered this Visit  Medications - No data to display  BP 105/57   Pulse 78   Temp 98.9 F (37.2 C) (Oral)   Resp 16   LMP 09/09/2016   SpO2 100%  No data found.   Physical Exam  Constitutional: She appears well-developed and well-nourished.  HENT:  Head: Normocephalic and atraumatic.  Right Ear: External ear normal.  Left Ear: External ear normal.  Mouth/Throat: Oropharynx is clear and moist.  Eyes: Conjunctivae and EOM are normal. Pupils are equal, round, and reactive to light.  Neck: Normal range of motion. Neck supple.  Cardiovascular: Normal rate, regular rhythm and normal heart sounds.   Pulmonary/Chest: Effort normal and breath sounds normal.  Abdominal: Soft. Bowel sounds are normal.  Nursing note and vitals reviewed.   Urgent Care Course   Clinical Course     Procedures (including critical care time)  Labs Review Labs Reviewed - No data to display  Imaging Review No results found.   Visual Acuity Review  Right Eye Distance:   Left Eye Distance:   Bilateral Distance:    Right Eye Near:   Left Eye Near:    Bilateral Near:         MDM   1. Viral URI with cough    Ipratropium nasal spray Tessalon perles  Push po fluids, rest, tylenol and motrin otc prn as directed for fever, arthralgias, and myalgias.  Follow up prn if sx's continue or persist.    Deatra CanterWilliam J Jonne Rote, FNP 09/19/16 1734    Anselm PancoastWilliam J  Tonni Mansour, FNP 09/19/16 1735

## 2016-09-27 ENCOUNTER — Encounter (HOSPITAL_COMMUNITY): Payer: Self-pay | Admitting: Emergency Medicine

## 2016-09-27 ENCOUNTER — Emergency Department (HOSPITAL_COMMUNITY)
Admission: EM | Admit: 2016-09-27 | Discharge: 2016-09-27 | Disposition: A | Discharge status: Home / Self Care | Payer: BLUE CROSS/BLUE SHIELD | Attending: Dermatology | Admitting: Dermatology

## 2016-09-27 DIAGNOSIS — R05 Cough: Secondary | ICD-10-CM | POA: Diagnosis present

## 2016-09-27 DIAGNOSIS — Z5321 Procedure and treatment not carried out due to patient leaving prior to being seen by health care provider: Secondary | ICD-10-CM | POA: Insufficient documentation

## 2016-09-27 NOTE — ED Triage Notes (Signed)
Pt sts cough x several weeks

## 2016-09-27 NOTE — ED Notes (Signed)
Pt left did not want to stay any longer.

## 2016-12-28 ENCOUNTER — Ambulatory Visit: Payer: Self-pay | Admitting: Pediatrics

## 2018-06-20 IMAGING — CR DG CHEST 2V
2 series · 2 of 2 positions shown · non-contrast
Comparison: 12/24/2014

CLINICAL DATA: Shortness of breath and chest pain left side for a
few months, worse over 24 hours.

EXAM:
CHEST  2 VIEW

[chest pa]
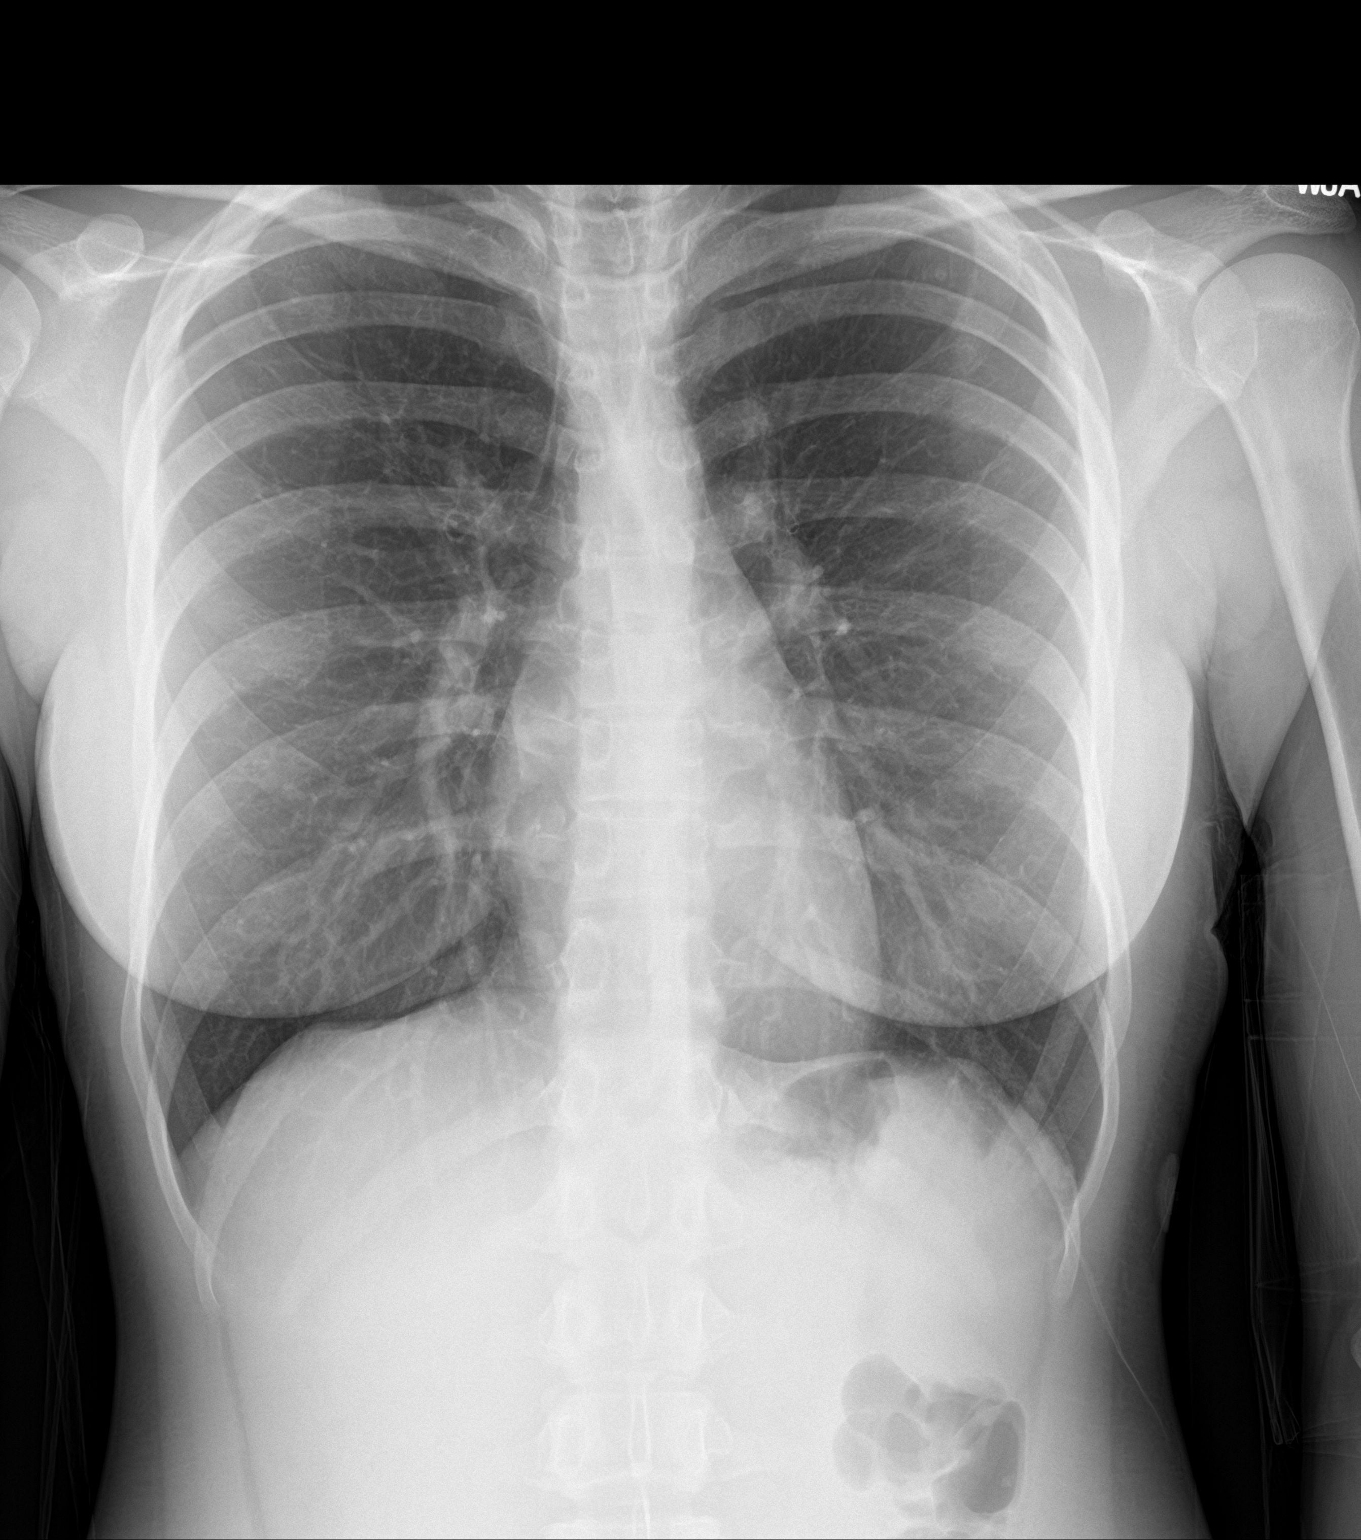

[chest lat]
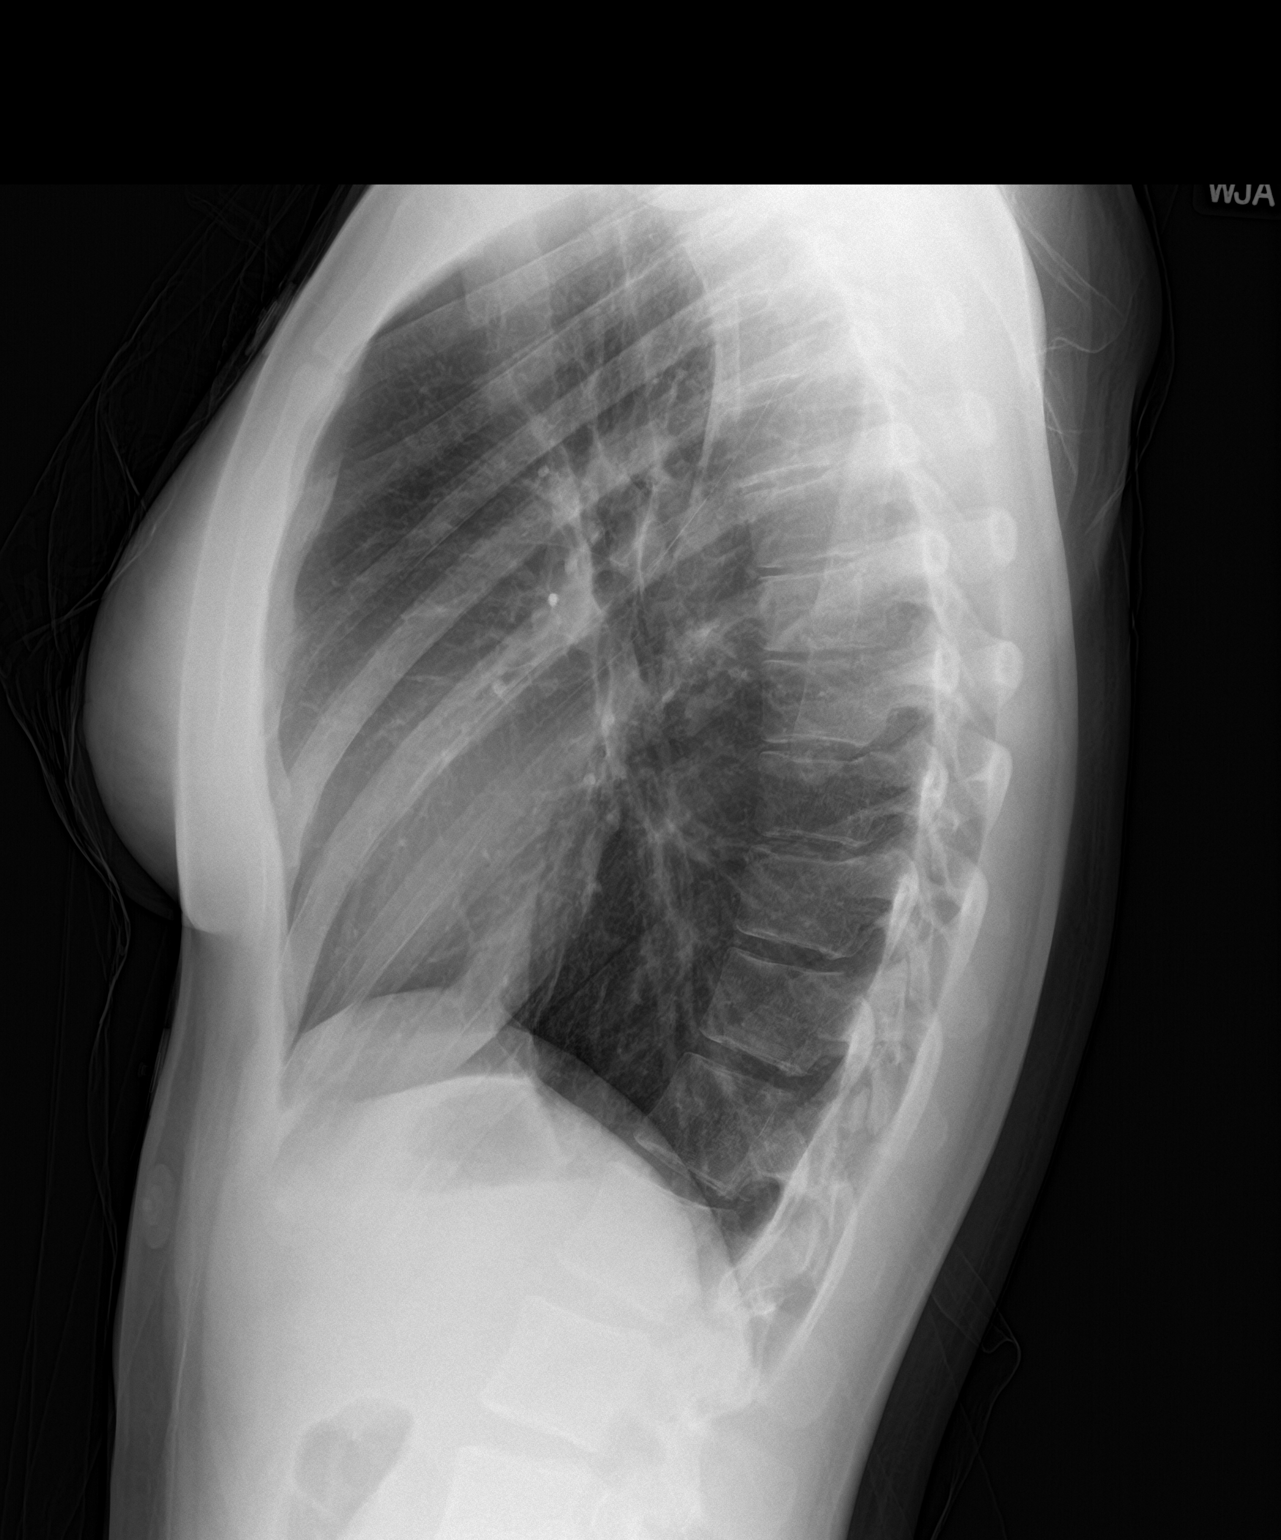

[2 of 2 positions shown; findings below may reference images not displayed]

FINDINGS: The heart size and mediastinal contours are within normal limits.
Both lungs are clear. The visualized skeletal structures are
unremarkable.
IMPRESSION: No active cardiopulmonary disease.

## 2018-09-25 ENCOUNTER — Ambulatory Visit: Payer: PRIVATE HEALTH INSURANCE

## 2019-01-08 ENCOUNTER — Encounter (HOSPITAL_COMMUNITY): Payer: Self-pay | Admitting: Emergency Medicine

## 2019-01-08 ENCOUNTER — Emergency Department (HOSPITAL_COMMUNITY)
Admission: EM | Admit: 2019-01-08 | Discharge: 2019-01-08 | Disposition: A | Discharge status: Home / Self Care | Payer: BLUE CROSS/BLUE SHIELD | Attending: Emergency Medicine | Admitting: Emergency Medicine

## 2019-01-08 ENCOUNTER — Other Ambulatory Visit: Payer: Self-pay

## 2019-01-08 DIAGNOSIS — N760 Acute vaginitis: Secondary | ICD-10-CM | POA: Insufficient documentation

## 2019-01-08 DIAGNOSIS — N939 Abnormal uterine and vaginal bleeding, unspecified: Secondary | ICD-10-CM

## 2019-01-08 DIAGNOSIS — Z87891 Personal history of nicotine dependence: Secondary | ICD-10-CM | POA: Insufficient documentation

## 2019-01-08 DIAGNOSIS — B9689 Other specified bacterial agents as the cause of diseases classified elsewhere: Secondary | ICD-10-CM | POA: Diagnosis not present

## 2019-01-08 LAB — URINALYSIS, ROUTINE W REFLEX MICROSCOPIC
Bilirubin Urine: NEGATIVE
Glucose, UA: NEGATIVE mg/dL
Ketones, ur: NEGATIVE mg/dL
Leukocytes,Ua: NEGATIVE
Nitrite: NEGATIVE
Protein, ur: NEGATIVE mg/dL
RBC / HPF: >50 RBC/hpf — ABNORMAL HIGH (ref 0–5)
Specific Gravity, Urine: 1.021 (ref 1.005–1.030)
pH: 7 (ref 5.0–8.0)

## 2019-01-08 LAB — I-STAT BETA HCG BLOOD, ED (MC, WL, AP ONLY): I-stat hCG, quantitative: <5 m[IU]/mL (ref <5)

## 2019-01-08 LAB — WET PREP, GENITAL
Sperm: NONE SEEN
Trich, Wet Prep: NONE SEEN
Yeast Wet Prep HPF POC: NONE SEEN

## 2019-01-08 MED ORDER — METRONIDAZOLE 500 MG PO TABS: 500.0000 mg | ORAL_TABLET | Freq: Two times a day (BID) | ORAL | 0 refills | Status: DC

## 2019-01-08 NOTE — ED Notes (Signed)
Patient verbalizes understanding of discharge instructions. Opportunity for questioning and answers were provided. Armband removed by staff, pt discharged from ED.  

## 2019-01-08 NOTE — Discharge Instructions (Signed)
You were seen in the ED today for vaginal bleeding and were sent home with prescription to treat bacterial vaginosis. Please follow up with your OBGYN regarding your continued bleeding. Return to the ED for any worsening symptoms including pelvic pain, dizziness, feeling like you are going to pass out, if you pass out, chest pain, or shortness of breath.

## 2019-01-08 NOTE — ED Triage Notes (Signed)
Pt reports 3 weeks of vaginal bleeding with increased heaviness today, has gone through 6 pads today.  Reports abdominal cramping.

## 2019-01-08 NOTE — ED Provider Notes (Signed)
[facility]   CSN: [id number] Arrival date & time: 01/08/19  1831    History   Chief Complaint No chief complaint on file.   HPI Sandra Vincent is a 23 y.o. female who presents to the ED complaining of vaginal bleeding x 3 weeks. Pt states she typically starts her period at the end of every month; this occurred at the end of March but has been ongoing since then, worsening in the last 3 days. She states that after her normal period time frame she began to spot but then the blood volume slowly began to increase; she endorses going through about 6 maxi pads per day in the last 3 days. Pt is sexually active with a female partner. Denies the chance of pregnancy as she has not had intercourse with a female in over 6 months. Reports hx of herpes; takes acyclovir as needed when she has outbreaks; denies recent outbreak. Denies fever, chills, nausea, vomiting, abdominal pain, pelvic pain, or any other associated symptoms.       History reviewed. No pertinent past medical history.  There are no active problems to display for this patient.   History reviewed. No pertinent surgical history.   OB History   No obstetric history on file.      Home Medications    Prior to Admission medications   Medication Sig Start Date End Date Taking? Authorizing Provider  valACYclovir (VALTREX) 1000 MG tablet Take 1,000 mg by mouth daily.   Yes [provider]  benzonatate (TESSALON) 100 MG capsule Take 1 capsule (100 mg total) by mouth every 8 (eight) hours. Patient not taking: Reported on 01/08/2019 09/19/16   Deatra Canterxford, William J, FNP  ipratropium (ATROVENT) 0.06 % nasal spray Place 2 sprays into both nostrils 4 (four) times daily. Patient not taking: Reported on 01/08/2019 09/19/16   Deatra Canterxford, William J, FNP  metroNIDAZOLE (FLAGYL) 500 MG tablet Take 1 tablet (500 mg total) by mouth 2 (two) times daily. 01/08/19   Tanda RockersVenter, Samuell Knoble, PA-C   norgestimate-ethinyl estradiol (ORTHO-CYCLEN,SPRINTEC,PREVIFEM) 0.25-35 MG-MCG tablet Take 1 tablet by mouth daily. Patient not taking: Reported on 01/08/2019 06/29/16   Rasch, Harolyn RutherfordJennifer I, NP    Family History History reviewed. No pertinent family history.  Social History Social History   Tobacco Use  . Smoking status: Former Games developermoker  . Smokeless tobacco: Never Used  Substance Use Topics  . Alcohol use: No  . Drug use: No    Types: Marijuana    Comment: occ     Allergies   Patient has no known allergies.   Review of Systems Review of Systems  Constitutional: Negative for chills and fever.  Gastrointestinal: Negative for abdominal pain, nausea and vomiting.  Genitourinary: Positive for vaginal bleeding. Negative for dysuria, frequency and pelvic pain.     Physical Exam Updated Vital Signs BP 112/69 (BP Location: Right Arm)   Pulse 71   Temp 97.6 F (36.4 C) (Oral)   Resp 16   LMP 01/08/2019   SpO2 100%   Physical Exam Vitals signs and nursing note reviewed. Exam conducted with a chaperone present.  Constitutional:      Appearance: She is not ill-appearing.  HENT:     Head: Normocephalic and atraumatic.  Eyes:     Conjunctiva/sclera: Conjunctivae normal.  Neck:     Musculoskeletal: Neck supple.  Cardiovascular:     Rate and Rhythm: Normal rate and regular rhythm.     Pulses: Normal pulses.  Pulmonary:  Effort: Pulmonary effort is normal.     Breath sounds: Normal breath sounds. No wheezing, rhonchi or rales.  Abdominal:     General: Abdomen is flat. Bowel sounds are normal.     Palpations: Abdomen is soft.     Tenderness: There is no abdominal tenderness. There is no guarding or rebound.  Genitourinary:    Labia:        Right: No lesion.        Left: No lesion.      Vagina: Normal.     Adnexa:        Right: No tenderness.         Left: No tenderness.       Comments: Chaperone present. Small amount of blood in vaginal vault. No discharge  visualized given amount of blood. No adnexal or cervical motion tenderness on bimanual exam.  Skin:    General: Skin is warm and dry.  Neurological:     Mental Status: She is alert.      ED Treatments / Results  Labs (all labs ordered are listed, but only abnormal results are displayed) Labs Reviewed  WET PREP, GENITAL - Abnormal; Notable for the following components:      Result Value   Clue Cells Wet Prep HPF POC PRESENT (*)    WBC, Wet Prep HPF POC MANY (*)    All other components within normal limits  URINALYSIS, ROUTINE W REFLEX MICROSCOPIC - Abnormal; Notable for the following components:   APPearance CLOUDY (*)    Hgb urine dipstick MODERATE (*)    RBC / HPF >50 (*)    Bacteria, UA RARE (*)    All other components within normal limits  HIV ANTIBODY (ROUTINE TESTING W REFLEX)  RPR  I-STAT BETA HCG BLOOD, ED ([facility], [facility], [facility] ONLY)  GC/CHLAMYDIA PROBE AMP ([place]) NOT AT [facility]    EKG None  Radiology No results found.  Procedures Procedures (including critical care time)  Medications Ordered in ED Medications - No data to display   Initial Impression / Assessment and Plan / ED Course  I have reviewed the triage vital signs and the nursing notes.  Pertinent labs & imaging results that were available during my care of the patient were reviewed by me and considered in my medical decision making (see chart for details).    Pt presents with vaginal bleeding x 3 weeks; worsening in the last 3 days. No pelvic pain, nausea, vomiting, fever, syncope, shortness of breath. Pelvic exam completed in the ED with wet prep, GC/chlamydia, and HIV/RPR per patient request. Mild bleeding to vaginal vault. Preg test negative. Do not suspect ectopic pregnancy. Low suspicion for ovarian torsion, TOA, PID given no pelvic pain. Pt also not tender during bimanual exam. Awaiting wet prep results prior to discharge with OBGYN follow up.   Wet prep with clue cells; will treat with Flagyl x 1  week. This may be incidental finding along with abnormal uterine bleeding. Discussed the importance of not drinking alcohol during this time with pt; she is in agreement with plan at this time. She has regular OBGYN and will follow up with them in the morning regarding symptoms. Also gave pt resource for [facility] Pt stable for discharge at this time.        Final Clinical Impressions(s) / ED Diagnoses   Final diagnoses:  Vaginal bleeding  Bacterial vaginosis    ED Discharge Orders         Ordered  metroNIDAZOLE (FLAGYL) 500 MG tablet  2 times daily     01/08/19 2108           Tanda Rockers, PA-C 01/08/19 2123    Sabas Sous, MD 01/09/19 1501

## 2019-01-09 LAB — GC/CHLAMYDIA PROBE AMP (~~LOC~~) NOT AT ARMC
Chlamydia: NEGATIVE
Neisseria Gonorrhea: NEGATIVE

## 2019-01-09 LAB — RPR: RPR Ser Ql: NONREACTIVE

## 2019-01-09 LAB — HIV ANTIBODY (ROUTINE TESTING W REFLEX): HIV Screen 4th Generation wRfx: NONREACTIVE

## 2019-05-24 ENCOUNTER — Other Ambulatory Visit: Payer: Self-pay

## 2019-05-24 ENCOUNTER — Encounter (HOSPITAL_COMMUNITY): Payer: Self-pay

## 2019-05-24 ENCOUNTER — Ambulatory Visit (HOSPITAL_COMMUNITY)
Admission: EM | Admit: 2019-05-24 | Discharge: 2019-05-24 | Disposition: A | Discharge status: Home / Self Care | Payer: BLUE CROSS/BLUE SHIELD | Attending: Family Medicine | Admitting: Family Medicine

## 2019-05-24 DIAGNOSIS — Z20822 Contact with and (suspected) exposure to covid-19: Secondary | ICD-10-CM

## 2019-05-24 DIAGNOSIS — J029 Acute pharyngitis, unspecified: Secondary | ICD-10-CM | POA: Diagnosis present

## 2019-05-24 DIAGNOSIS — R6889 Other general symptoms and signs: Secondary | ICD-10-CM | POA: Diagnosis present

## 2019-05-24 DIAGNOSIS — Z20828 Contact with and (suspected) exposure to other viral communicable diseases: Secondary | ICD-10-CM | POA: Diagnosis not present

## 2019-05-24 LAB — POCT RAPID STREP A: Streptococcus, Group A Screen (Direct): NEGATIVE

## 2019-05-24 NOTE — ED Provider Notes (Signed)
[place]    CSN: [id number] Arrival date & time: 05/24/19  1632      History   Chief Complaint Chief Complaint  Patient presents with  . Sore Throat  . Generalized Body Aches    HPI Sandra Vincent is a 23 y.o. female.   HPI  Patient is here for coronavirus testing.  She has had a general body aches sore throat she does has loss of smell at her place of employment states that she cannot come back to work until she is had negative coronavirus testing.  She has no fever chills.  She has no shortness of breath or cough.  She has no nausea vomiting or diarrhea.  She has no known exposure to coronavirus  History reviewed. No pertinent past medical history.  There are no active problems to display for this patient.   History reviewed. No pertinent surgical history.  OB History   No obstetric history on file.      Home Medications    Prior to Admission medications   Medication Sig Start Date End Date Taking? Authorizing Provider  valACYclovir (VALTREX) 1000 MG tablet Take 1,000 mg by mouth daily.    [provider]  ipratropium (ATROVENT) 0.06 % nasal spray Place 2 sprays into both nostrils 4 (four) times daily. Patient not taking: Reported on 01/08/2019 09/19/16 05/24/19  Lysbeth Penner, FNP  norgestimate-ethinyl estradiol (ORTHO-CYCLEN,SPRINTEC,PREVIFEM) 0.25-35 MG-MCG tablet Take 1 tablet by mouth daily. Patient not taking: Reported on 01/08/2019 06/29/16 05/24/19  Rasch, Artist Pais, NP    Family History Family History  Family history unknown: Yes    Social History Social History   Tobacco Use  . Smoking status: Former Research scientist (life sciences)  . Smokeless tobacco: Never Used  Substance Use Topics  . Alcohol use: No  . Drug use: No    Types: Marijuana    Comment: occ     Allergies   Patient has no known allergies.   Review of Systems Review of Systems  Constitutional: Negative for chills and fever.  HENT: Positive for sore throat. Negative for ear  pain.   Eyes: Negative for pain and visual disturbance.  Respiratory: Negative for cough and shortness of breath.   Cardiovascular: Negative for chest pain and palpitations.  Gastrointestinal: Negative for abdominal pain and vomiting.  Genitourinary: Negative for dysuria and hematuria.  Musculoskeletal: Negative for arthralgias and back pain.  Skin: Negative for color change and rash.  Neurological: Negative for seizures and syncope.  All other systems reviewed and are negative.    Physical Exam Triage Vital Signs ED Triage Vitals  Enc Vitals Group     BP 05/24/19 1701 104/64     Pulse Rate 05/24/19 1701 (!) 56     Resp 05/24/19 1701 16     Temp 05/24/19 1701 98.6 F (37 C)     Temp Source 05/24/19 1701 Oral     SpO2 05/24/19 1701 100 %     Weight --      Height --      Head Circumference --      Peak Flow --      Pain Score 05/24/19 1703 4     Pain Loc --      Pain Edu? --      Excl. in [place]? --    No data found.  Updated Vital Signs BP 104/64 (BP Location: Left Arm)   Pulse (!) 56   Temp 98.6 F (37 C) (Oral)   Resp 16  LMP 05/18/2019   SpO2 100%   Visual Acuity Right Eye Distance:   Left Eye Distance:   Bilateral Distance:    Right Eye Near:   Left Eye Near:    Bilateral Near:     Physical Exam Constitutional:      General: She is not in acute distress.    Appearance: She is well-developed.  HENT:     Head: Normocephalic and atraumatic.     Mouth/Throat:     Tonsils: No tonsillar exudate or tonsillar abscesses.     Comments: Posterior pharynx slightly injected Eyes:     Conjunctiva/sclera: Conjunctivae normal.     Pupils: Pupils are equal, round, and reactive to light.  Neck:     Musculoskeletal: Normal range of motion.  Cardiovascular:     Rate and Rhythm: Normal rate.  Pulmonary:     Effort: Pulmonary effort is normal. No respiratory distress.  Abdominal:     General: There is no distension.     Palpations: Abdomen is soft.   Musculoskeletal: Normal range of motion.  Lymphadenopathy:     Cervical: No cervical adenopathy.  Skin:    General: Skin is warm and dry.  Neurological:     Mental Status: She is alert.  Psychiatric:        Mood and Affect: Mood normal.        Behavior: Behavior normal.   Patient looks well   UC Treatments / Results  Labs (all labs ordered are listed, but only abnormal results are displayed) Labs Reviewed  NOVEL CORONAVIRUS, NAA (HOSP ORDER, SEND-OUT TO REF LAB; TAT 18-24 HRS)  CULTURE, GROUP A STREP [facility])  POCT RAPID STREP A    EKG   Radiology No results found.  Procedures Procedures (including critical care time)  Medications Ordered in UC Medications - No data to display  Initial Impression / Assessment and Plan / UC Course  I have reviewed the triage vital signs and the nursing notes.  Pertinent labs & imaging results that were available during my care of the patient were reviewed by me and considered in my medical decision making (see chart for details).     Explained patient's the importance of quarantine until the coronavirus test is available Final Clinical Impressions(s) / UC Diagnoses   Final diagnoses:  Sore throat  Suspected Covid-19 Virus Infection     Discharge Instructions     Take Tylenol for any pain and fever Drink plenty of fluids May take over-the-counter cough and cold medicine as needed We will call you if your strep test or your COVID tests are positive You must quarantine at home until you have the results of your COVID test     Person Under Monitoring Name: Sandra Vincent  Location: [address] [place] [address]   Infection Prevention Recommendations for Individuals Confirmed to have, or Being Evaluated for, 2019 Novel Coronavirus (COVID-19) Infection Who Receive Care at Home  Individuals who are confirmed to have, or are being evaluated for, COVID-19 should follow the prevention steps below until a healthcare  provider or local or state health department says they can return to normal activities.  Stay home except to get medical care You should restrict activities outside your home, except for getting medical care. Do not go to work, school, or public areas, and do not use public transportation or taxis.  Call ahead before visiting your doctor Before your medical appointment, call the healthcare provider and tell them that you have, or are being  evaluated for, COVID-19 infection. This will help the healthcare provider's office take steps to keep other people from getting infected. Ask your healthcare provider to call the local or state health department.  Monitor your symptoms Seek prompt medical attention if your illness is worsening (e.g., difficulty breathing). Before going to your medical appointment, call the healthcare provider and tell them that you have, or are being evaluated for, COVID-19 infection. Ask your healthcare provider to call the local or state health department.  Wear a facemask You should wear a facemask that covers your nose and mouth when you are in the same room with other people and when you visit a healthcare provider. People who live with or visit you should also wear a facemask while they are in the same room with you.  Separate yourself from other people in your home As much as possible, you should stay in a different room from other people in your home. Also, you should use a separate bathroom, if available.  Avoid sharing household items You should not share dishes, drinking glasses, cups, eating utensils, towels, bedding, or other items with other people in your home. After using these items, you should wash them thoroughly with soap and water.  Cover your coughs and sneezes Cover your mouth and nose with a tissue when you cough or sneeze, or you can cough or sneeze into your sleeve. Throw used tissues in a lined trash can, and immediately wash your hands  with soap and water for at least 20 seconds or use an alcohol-based hand rub.  Wash your [employer]hands Wash your hands often and thoroughly with soap and water for at least 20 seconds. You can use an alcohol-based hand sanitizer if soap and water are not available and if your hands are not visibly dirty. Avoid touching your eyes, nose, and mouth with unwashed hands.   Prevention Steps for Caregivers and Household Members of Individuals Confirmed to have, or Being Evaluated for, COVID-19 Infection Being Cared for in the Home  If you live with, or provide care at home for, a person confirmed to have, or being evaluated for, COVID-19 infection please follow these guidelines to prevent infection:  Follow healthcare provider's instructions Make sure that you understand and can help the patient follow any healthcare provider instructions for all care.  Provide for the patient's basic needs You should help the patient with basic needs in the home and provide support for getting groceries, prescriptions, and other personal needs.  Monitor the patient's symptoms If they are getting sicker, call his or her medical provider and tell them that the patient has, or is being evaluated for, COVID-19 infection. This will help the healthcare provider's office take steps to keep other people from getting infected. Ask the healthcare provider to call the local or state health department.  Limit the number of people who have contact with the patient  If possible, have only one caregiver for the patient.  Other household members should stay in another home or place of residence. If this is not possible, they should stay  in another room, or be separated from the patient as much as possible. Use a separate bathroom, if available.  Restrict visitors who do not have an essential need to be in the home.  Keep older adults, very young children, and other sick people away from the patient Keep older adults, very  young children, and those who have compromised immune systems or chronic health conditions away from the patient. This includes  people with chronic heart, lung, or kidney conditions, diabetes, and cancer.  Ensure good ventilation Make sure that shared spaces in the home have good air flow, such as from an air conditioner or an opened window, weather permitting.  Wash your hands often  Wash your hands often and thoroughly with soap and water for at least 20 seconds. You can use an alcohol based hand sanitizer if soap and water are not available and if your hands are not visibly dirty.  Avoid touching your eyes, nose, and mouth with unwashed hands.  Use disposable paper towels to dry your hands. If not available, use dedicated cloth towels and replace them when they become wet.  Wear a facemask and gloves  Wear a disposable facemask at all times in the room and gloves when you touch or have contact with the patient's blood, body fluids, and/or secretions or excretions, such as sweat, saliva, sputum, nasal mucus, vomit, urine, or feces.  Ensure the mask fits over your nose and mouth tightly, and do not touch it during use.  Throw out disposable facemasks and gloves after using them. Do not reuse.  Wash your hands immediately after removing your facemask and gloves.  If your personal clothing becomes contaminated, carefully remove clothing and launder. Wash your hands after handling contaminated clothing.  Place all used disposable facemasks, gloves, and other waste in a lined container before disposing them with other household waste.  Remove gloves and wash your hands immediately after handling these items.  Do not share dishes, glasses, or other household items with the patient  Avoid sharing household items. You should not share dishes, drinking glasses, cups, eating utensils, towels, bedding, or other items with a patient who is confirmed to have, or being evaluated for, COVID-19  infection.  After the person uses these items, you should wash them thoroughly with soap and water.  Wash laundry thoroughly  Immediately remove and wash clothes or bedding that have blood, body fluids, and/or secretions or excretions, such as sweat, saliva, sputum, nasal mucus, vomit, urine, or feces, on them.  Wear gloves when handling laundry from the patient.  Read and follow directions on labels of laundry or clothing items and detergent. In general, wash and dry with the warmest temperatures recommended on the label.  Clean all areas the individual has used often  Clean all touchable surfaces, such as counters, tabletops, doorknobs, bathroom fixtures, toilets, phones, keyboards, tablets, and bedside tables, every day. Also, clean any surfaces that may have blood, body fluids, and/or secretions or excretions on them.  Wear gloves when cleaning surfaces the patient has come in contact with.  Use a diluted bleach solution (e.g., dilute bleach with 1 part bleach and 10 parts water) or a household disinfectant with a label that says EPA-registered for coronaviruses. To make a bleach solution at home, add 1 tablespoon of bleach to 1 quart (4 cups) of water. For a larger supply, add  cup of bleach to 1 gallon (16 cups) of water.  Read labels of cleaning products and follow recommendations provided on product labels. Labels contain instructions for safe and effective use of the cleaning product including precautions you should take when applying the product, such as wearing gloves or eye protection and making sure you have good ventilation during use of the product.  Remove gloves and wash hands immediately after cleaning.  Monitor yourself for signs and symptoms of illness Caregivers and household members are considered close contacts, should monitor their health, and will be  asked to limit movement outside of the home to the extent possible. Follow the monitoring steps for close contacts  listed on the symptom monitoring form.   ? If you have additional questions, contact your local health department or call the epidemiologist on call at [phone] (available 24/7). ? This guidance is subject to change. For the most up-to-date guidance from [facility], please refer to their website: [email or web address]    ED Prescriptions    None     Controlled Substance Prescriptions [place] Controlled Substance Registry consulted? Not Applicable   Eustace Moore, MD 05/24/19 2135

## 2019-05-24 NOTE — ED Triage Notes (Signed)
Pt presents with generalized body aches, sore throat, and loss of smell X 5 days and he place of employment is requesting covid testing.

## 2019-05-24 NOTE — Discharge Instructions (Signed)
Take Tylenol for any pain and fever Drink plenty of fluids May take over-the-counter cough and cold medicine as needed We will call you if your strep test or your COVID tests are positive You must quarantine at home until you have the results of your COVID test     Person Under Monitoring Name: Sandra Vincent  Location: [address] [place] [address]   Infection Prevention Recommendations for Individuals Confirmed to have, or Being Evaluated for, 2019 Novel Coronavirus (COVID-19) Infection Who Receive Care at Home  Individuals who are confirmed to have, or are being evaluated for, COVID-19 should follow the prevention steps below until a healthcare provider or local or state health department says they can return to normal activities.  Stay home except to get medical care You should restrict activities outside your home, except for getting medical care. Do not go to work, school, or public areas, and do not use public transportation or taxis.  Call ahead before visiting your doctor Before your medical appointment, call the healthcare provider and tell them that you have, or are being evaluated for, COVID-19 infection. This will help the healthcare providers office take steps to keep other people from getting infected. Ask your healthcare provider to call the local or state health department.  Monitor your symptoms Seek prompt medical attention if your illness is worsening (e.g., difficulty breathing). Before going to your medical appointment, call the healthcare provider and tell them that you have, or are being evaluated for, COVID-19 infection. Ask your healthcare provider to call the local or state health department.  Wear a facemask You should wear a facemask that covers your nose and mouth when you are in the same room with other people and when you visit a healthcare provider. People who live with or visit you should also wear a facemask while they are in the same  room with you.  Separate yourself from other people in your home As much as possible, you should stay in a different room from other people in your home. Also, you should use a separate bathroom, if available.  Avoid sharing household items You should not share dishes, drinking glasses, cups, eating utensils, towels, bedding, or other items with other people in your home. After using these items, you should wash them thoroughly with soap and water.  Cover your coughs and sneezes Cover your mouth and nose with a tissue when you cough or sneeze, or you can cough or sneeze into your sleeve. Throw used tissues in a lined trash can, and immediately wash your hands with soap and water for at least 20 seconds or use an alcohol-based hand rub.  Wash your [employer] [employer] your hands often and thoroughly with soap and water for at least 20 seconds. You can use an alcohol-based hand sanitizer if soap and water are not available and if your hands are not visibly dirty. Avoid touching your eyes, nose, and mouth with unwashed hands.   Prevention Steps for Caregivers and Household Members of Individuals Confirmed to have, or Being Evaluated for, COVID-19 Infection Being Cared for in the Home  If you live with, or provide care at home for, a person confirmed to have, or being evaluated for, COVID-19 infection please follow these guidelines to prevent infection:  Follow healthcare providers instructions Make sure that you understand and can help the patient follow any healthcare provider instructions for all care.  Provide for the patients basic needs You should help the patient with basic needs in the  home and provide support for getting groceries, prescriptions, and other personal needs.  Monitor the patients symptoms If they are getting sicker, call his or her medical provider and tell them that the patient has, or is being evaluated for, COVID-19 infection. This will help the healthcare  providers office take steps to keep other people from getting infected. Ask the healthcare provider to call the local or state health department.  Limit the number of people who have contact with the patient If possible, have only one caregiver for the patient. Other household members should stay in another home or place of residence. If this is not possible, they should stay in another room, or be separated from the patient as much as possible. Use a separate bathroom, if available. Restrict visitors who do not have an essential need to be in the home.  Keep older adults, very young children, and other sick people away from the patient Keep older adults, very young children, and those who have compromised immune systems or chronic health conditions away from the patient. This includes people with chronic heart, lung, or kidney conditions, diabetes, and cancer.  Ensure good ventilation Make sure that shared spaces in the home have good air flow, such as from an air conditioner or an opened window, weather permitting.  Wash your hands often Wash your hands often and thoroughly with soap and water for at least 20 seconds. You can use an alcohol based hand sanitizer if soap and water are not available and if your hands are not visibly dirty. Avoid touching your eyes, nose, and mouth with unwashed hands. Use disposable paper towels to dry your hands. If not available, use dedicated cloth towels and replace them when they become wet.  Wear a facemask and gloves Wear a disposable facemask at all times in the room and gloves when you touch or have contact with the patients blood, body fluids, and/or secretions or excretions, such as sweat, saliva, sputum, nasal mucus, vomit, urine, or feces.  Ensure the mask fits over your nose and mouth tightly, and do not touch it during use. Throw out disposable facemasks and gloves after using them. Do not reuse. Wash your hands immediately after removing your  facemask and gloves. If your personal clothing becomes contaminated, carefully remove clothing and launder. Wash your hands after handling contaminated clothing. Place all used disposable facemasks, gloves, and other waste in a lined container before disposing them with other household waste. Remove gloves and wash your hands immediately after handling these items.  Do not share dishes, glasses, or other household items with the patient Avoid sharing household items. You should not share dishes, drinking glasses, cups, eating utensils, towels, bedding, or other items with a patient who is confirmed to have, or being evaluated for, COVID-19 infection. After the person uses these items, you should wash them thoroughly with soap and water.  Wash laundry thoroughly Immediately remove and wash clothes or bedding that have blood, body fluids, and/or secretions or excretions, such as sweat, saliva, sputum, nasal mucus, vomit, urine, or feces, on them. Wear gloves when handling laundry from the patient. Read and follow directions on labels of laundry or clothing items and detergent. In general, wash and dry with the warmest temperatures recommended on the label.  Clean all areas the individual has used often Clean all touchable surfaces, such as counters, tabletops, doorknobs, bathroom fixtures, toilets, phones, keyboards, tablets, and bedside tables, every day. Also, clean any surfaces that may have blood, body fluids, and/or  secretions or excretions on them. Wear gloves when cleaning surfaces the patient has come in contact with. Use a diluted bleach solution (e.g., dilute bleach with 1 part bleach and 10 parts water) or a household disinfectant with a label that says EPA-registered for coronaviruses. To make a bleach solution at home, add 1 tablespoon of bleach to 1 quart (4 cups) of water. For a larger supply, add  cup of bleach to 1 gallon (16 cups) of water. Read labels of cleaning products and  follow recommendations provided on product labels. Labels contain instructions for safe and effective use of the cleaning product including precautions you should take when applying the product, such as wearing gloves or eye protection and making sure you have good ventilation during use of the product. Remove gloves and wash hands immediately after cleaning.  Monitor yourself for signs and symptoms of illness Caregivers and household members are considered close contacts, should monitor their health, and will be asked to limit movement outside of the home to the extent possible. Follow the monitoring steps for close contacts listed on the symptom monitoring form.   ? If you have additional questions, contact your local health department or call the epidemiologist on call at [phone] (available 24/7). ? This guidance is subject to change. For the most up-to-date guidance from [facility], please refer to their website: [email or web address].hu

## 2019-05-26 LAB — NOVEL CORONAVIRUS, NAA (HOSP ORDER, SEND-OUT TO REF LAB; TAT 18-24 HRS): SARS-CoV-2, NAA: NOT DETECTED

## 2019-05-27 LAB — CULTURE, GROUP A STREP (THRC)

## 2019-10-08 ENCOUNTER — Other Ambulatory Visit: Payer: Self-pay

## 2019-10-08 ENCOUNTER — Encounter (HOSPITAL_COMMUNITY): Payer: Self-pay | Admitting: Emergency Medicine

## 2019-10-08 ENCOUNTER — Emergency Department (HOSPITAL_COMMUNITY)
Admission: EM | Admit: 2019-10-08 | Discharge: 2019-10-08 | Disposition: A | Discharge status: Home / Self Care | Payer: BLUE CROSS/BLUE SHIELD | Attending: Emergency Medicine | Admitting: Emergency Medicine

## 2019-10-08 DIAGNOSIS — Z87891 Personal history of nicotine dependence: Secondary | ICD-10-CM | POA: Insufficient documentation

## 2019-10-08 DIAGNOSIS — Y929 Unspecified place or not applicable: Secondary | ICD-10-CM | POA: Insufficient documentation

## 2019-10-08 DIAGNOSIS — F1210 Cannabis abuse, uncomplicated: Secondary | ICD-10-CM | POA: Insufficient documentation

## 2019-10-08 DIAGNOSIS — Y939 Activity, unspecified: Secondary | ICD-10-CM | POA: Diagnosis not present

## 2019-10-08 DIAGNOSIS — Z79899 Other long term (current) drug therapy: Secondary | ICD-10-CM | POA: Diagnosis not present

## 2019-10-08 DIAGNOSIS — Y33XXXA Other specified events, undetermined intent, initial encounter: Secondary | ICD-10-CM | POA: Insufficient documentation

## 2019-10-08 DIAGNOSIS — Y999 Unspecified external cause status: Secondary | ICD-10-CM | POA: Diagnosis not present

## 2019-10-08 DIAGNOSIS — T189XXA Foreign body of alimentary tract, part unspecified, initial encounter: Secondary | ICD-10-CM | POA: Diagnosis not present

## 2019-10-08 MED ORDER — LIDOCAINE VISCOUS HCL 2 % MT SOLN
15.0000 mL | Freq: Once | OROMUCOSAL | Status: AC
  Administered 2019-10-08: 15:00:00 15 mL via OROMUCOSAL
  Filled 2019-10-08: qty 15

## 2019-10-08 NOTE — ED Notes (Addendum)
Pt given water for PO challenge, no issues swallowing. Pt passed

## 2019-10-08 NOTE — Discharge Instructions (Signed)
Return to ED if you have difficulty breathing. Stay hydrated. The gum will pass through your system in time.

## 2019-10-08 NOTE — ED Notes (Signed)
Pt verbalized understanding of discharge instructions. PT had no further questions, pt ambulated independently to lobby.

## 2019-10-08 NOTE — ED Provider Notes (Signed)
[facility] EMERGENCY DEPARTMENT Provider Note   CSN: [id number] Arrival date & time: 10/08/19  1254     History Chief Complaint  Patient presents with  . Swallowed Foreign Body    Sandra Vincent is a 24 y.o. female.  HPI  Patient presents today with foreign body sensation in her throat.  States that she swallowed some gum earlier --30 minutes ago.  States she is able to tolerate p.o. fluids.  Has not attempted eating any solids yet.  Denies any irritation but states that she feels that she is the capsule stuck in her throat.  No nausea vomiting abdominal pain or difficulty breathing.  No pain.  Patient states sensation is constant, with no aggravating or mitigating factors.  She has taken no medications or had no interventions prior to assessment.     History reviewed. No pertinent past medical history.  There are no problems to display for this patient.   History reviewed. No pertinent surgical history.   OB History   No obstetric history on file.     Family History  Family history unknown: Yes    Social History   Tobacco Use  . Smoking status: Former Games developer  . Smokeless tobacco: Never Used  Substance Use Topics  . Alcohol use: No  . Drug use: No    Types: Marijuana    Comment: occ    Home Medications Prior to Admission medications   Medication Sig Start Date End Date Taking? Authorizing Provider  valACYclovir (VALTREX) 1000 MG tablet Take 1,000 mg by mouth daily.    [provider]  ipratropium (ATROVENT) 0.06 % nasal spray Place 2 sprays into both nostrils 4 (four) times daily. Patient not taking: Reported on 01/08/2019 09/19/16 05/24/19  Deatra Canter, FNP  norgestimate-ethinyl estradiol (ORTHO-CYCLEN,SPRINTEC,PREVIFEM) 0.25-35 MG-MCG tablet Take 1 tablet by mouth daily. Patient not taking: Reported on 01/08/2019 06/29/16 05/24/19  Rasch, Harolyn Rutherford, NP    Allergies    Patient has no known allergies.  Review of Systems    Review of Systems  Constitutional: Negative for fever.  HENT: Negative for congestion, sore throat and trouble swallowing.        Foreign body sensation in throat  Respiratory: Negative for shortness of breath.   Cardiovascular: Negative for chest pain.  Gastrointestinal: Negative for abdominal distention.  Neurological: Negative for dizziness and headaches.    Physical Exam Updated Vital Signs BP 111/79   Pulse 75   Temp 98.9 F (37.2 C) (Oral)   Resp 18   Ht 5\' 5"  (1.651 m)   Wt 56.7 kg   LMP 09/19/2019   SpO2 100%   BMI 20.80 kg/m   Physical Exam Vitals and nursing note reviewed.  Constitutional:      General: She is not in acute distress.    Appearance: Normal appearance. She is not ill-appearing.  HENT:     Head: Normocephalic and atraumatic.     Mouth/Throat:     Mouth: Mucous membranes are moist.     Comments: History pharynx clear with no tonsillar exudate or erythema.  No foreign body evident.  Patient is managing secretions well with no difficulty swallowing no pain with swallowing.  No uvular deviation. Eyes:     General: No scleral icterus.       Right eye: No discharge.        Left eye: No discharge.     Conjunctiva/sclera: Conjunctivae normal.  Cardiovascular:     Rate and Rhythm: Normal rate.  Pulmonary:     Effort: Pulmonary effort is normal.     Breath sounds: No stridor.  Musculoskeletal:     Cervical back: Normal range of motion and neck supple.  Lymphadenopathy:     Cervical: No cervical adenopathy.  Neurological:     Mental Status: She is alert and oriented to person, place, and time. Mental status is at baseline.     ED Results / Procedures / Treatments   Labs (all labs ordered are listed, but only abnormal results are displayed) Labs Reviewed - No data to display  EKG None  Radiology No results found.  Procedures Procedures (including critical care time)  Medications Ordered in ED Medications  lidocaine (XYLOCAINE) 2 %  viscous mouth solution 15 mL (15 mLs Mouth/Throat Given 10/08/19 1437)    ED Course  I have reviewed the triage vital signs and the nursing notes.  Pertinent labs & imaging results that were available during my care of the patient were reviewed by me and considered in my medical decision making (see chart for details).    MDM Rules/Calculators/A&P                      Patient presents to ED for body sensation after swallowing.  Foreign body sensation was not present prior to her swallowing down.  She has no fevers, sore throat, is tolerating secretions well with no difficulty swallowing fluids.  Has not endeavored to eat anything yet.  Patient p.o. challenge she is able to drink water.  Patient has no difficulty breathing.  Patient is well-appearing.  I cannot see, in her posterior pharynx and as it is not obstructing her airway or esophagus at this time believe patient is stable for discharge.  Patient given return precautions.  Patient discharged home after single dose of 50 mL of viscous lidocaine to numb her throat.  Patient tolerated this well is feeling improved.  Vital signs within normal limits during entirety of ED stay.   Final Clinical Impression(s) / ED Diagnoses Final diagnoses:  Swallowed foreign body, initial encounter    Rx / DC Orders ED Discharge Orders    None       Tedd Sias, [place] 10/08/19 1526    Elnora Morrison, MD 10/09/19 [phone]

## 2019-10-08 NOTE — ED Triage Notes (Signed)
Pt reports swallowing gum, feels like its still stuck in her throat. Pt awake, alert, VSS, speaking in full sentences. NAD at present.

## 2020-01-15 ENCOUNTER — Ambulatory Visit (INDEPENDENT_AMBULATORY_CARE_PROVIDER_SITE_OTHER): Payer: Self-pay | Admitting: Advanced Practice Midwife

## 2020-01-15 ENCOUNTER — Other Ambulatory Visit (HOSPITAL_COMMUNITY)
Admission: RE | Admit: 2020-01-15 | Discharge: 2020-01-15 | Disposition: A | Discharge status: Home / Self Care | Payer: BLUE CROSS/BLUE SHIELD | Source: Ambulatory Visit | Attending: Advanced Practice Midwife | Admitting: Advanced Practice Midwife

## 2020-01-15 ENCOUNTER — Other Ambulatory Visit: Payer: Self-pay

## 2020-01-15 ENCOUNTER — Encounter: Payer: Self-pay | Admitting: Family Medicine

## 2020-01-15 ENCOUNTER — Encounter: Payer: Self-pay | Admitting: Advanced Practice Midwife

## 2020-01-15 VITALS — BP 105/65 | HR 72 | Wt 123.7 lb

## 2020-01-15 DIAGNOSIS — N939 Abnormal uterine and vaginal bleeding, unspecified: Secondary | ICD-10-CM | POA: Insufficient documentation

## 2020-01-15 NOTE — Progress Notes (Signed)
Year now bleeding.

## 2020-01-15 NOTE — Progress Notes (Signed)
  Subjective:     Patient ID: Sandra Vincent, female   DOB: [birth year], 24 y.o.   MRN: [id number]  Sandra Vincent is a 24 y.o. G0 who presents today with vaginal bleeding. She states that for the last year she has been bleeding every day. She states that there is no predictability to the bleeding. Some day the bleeding will be light and some days it will be heavier. She also has possibly noticed blood in her stools, but she is unsure about this. She also has had bloody noses. Patient has never been on hormonal birth control.   Vaginal Bleeding The patient's primary symptoms include vaginal bleeding. This is a new problem. The current episode started more than 1 year ago. The problem occurs constantly. The problem has been gradually worsening. She is not pregnant. Nothing aggravates the symptoms. She has tried nothing for the symptoms. She uses nothing for contraception.     Review of Systems  Constitutional: Positive for fatigue.  Genitourinary: Positive for vaginal bleeding.  All other systems reviewed and are negative.      Objective:   Physical Exam Vitals and nursing note reviewed. Exam conducted with a chaperone present.  Constitutional:      Appearance: Normal appearance.  HENT:     Head: Normocephalic.  Cardiovascular:     Rate and Rhythm: Normal rate.  Pulmonary:     Effort: Pulmonary effort is normal.  Genitourinary:    Comments:  External: no lesion Vagina: small amount of blood and mucous  Cervix: pink, smooth, no CMT Uterus: NSSC Adnexa: NT  Neurological:     General: No focal deficit present.     Mental Status: She is alert and oriented to person, place, and time.  Psychiatric:        Mood and Affect: Mood normal.        Behavior: Behavior normal.        Assessment:   1. Abnormal uterine bleeding       Plan:     Consult with Dr. Vergie Vincent and plan for the following:   Orders Placed This Encounter  Procedures  . US Pelvis Complete    Standing Status:    Future    Standing Expiration Date:   03/16/2021    Order Specific Question:   Reason for Exam (SYMPTOM  OR DIAGNOSIS REQUIRED)    Answer:   abnormal uterine bleeding    Order Specific Question:   Preferred imaging location?    Answer:   [facility]  . CBC  . Thyroid Panel With TSH  . Von Willebrand panel  . Protime-INR  . PTT  . Beta hCG quant (ref lab)   Plan for her to FU with a MD provider once labs and ultrasound have been completed.   Sandra Sheller DNP, CNM  01/15/20  3:31 PM

## 2020-01-16 LAB — APTT: aPTT: 26 s (ref 24–33)

## 2020-01-16 LAB — CBC
Hematocrit: 40.4 % (ref 34.0–46.6)
Hemoglobin: 13.3 g/dL (ref 11.1–15.9)
MCH: 31.8 pg (ref 26.6–33.0)
MCHC: 32.9 g/dL (ref 31.5–35.7)
MCV: 97 fL (ref 79–97)
Platelets: 178 10*3/uL (ref 150–450)
RBC: 4.18 x10E6/uL (ref 3.77–5.28)
RDW: 13.3 % (ref 11.7–15.4)
WBC: 4.9 10*3/uL (ref 3.4–10.8)

## 2020-01-16 LAB — PROTIME-INR
INR: 1 (ref 0.9–1.2)
Prothrombin Time: 10.8 s (ref 9.1–12.0)

## 2020-01-16 LAB — COAG STUDIES INTERP REPORT

## 2020-01-16 LAB — VON WILLEBRAND PANEL
Factor VIII Activity: 231 % — ABNORMAL HIGH (ref 56–140)
Von Willebrand Ag: 147 % (ref 50–200)
Von Willebrand Factor: 170 % (ref 50–200)

## 2020-01-16 LAB — THYROID PANEL WITH TSH
Free Thyroxine Index: 1.6 (ref 1.2–4.9)
T3 Uptake Ratio: 28 % (ref 24–39)
T4, Total: 5.6 ug/dL (ref 4.5–12.0)
TSH: 0.401 u[IU]/mL — ABNORMAL LOW (ref 0.450–4.500)

## 2020-01-16 LAB — BETA HCG QUANT (REF LAB): hCG Quant: <1 m[IU]/mL

## 2020-01-22 ENCOUNTER — Encounter (HOSPITAL_COMMUNITY): Payer: Self-pay | Admitting: Emergency Medicine

## 2020-01-22 ENCOUNTER — Other Ambulatory Visit: Payer: Self-pay

## 2020-01-22 ENCOUNTER — Ambulatory Visit (HOSPITAL_COMMUNITY)
Admission: EM | Admit: 2020-01-22 | Discharge: 2020-01-22 | Disposition: A | Discharge status: Home / Self Care | Payer: BLUE CROSS/BLUE SHIELD

## 2020-01-22 DIAGNOSIS — R7989 Other specified abnormal findings of blood chemistry: Secondary | ICD-10-CM

## 2020-01-22 DIAGNOSIS — R791 Abnormal coagulation profile: Secondary | ICD-10-CM

## 2020-01-22 DIAGNOSIS — R040 Epistaxis: Secondary | ICD-10-CM

## 2020-01-22 DIAGNOSIS — N939 Abnormal uterine and vaginal bleeding, unspecified: Secondary | ICD-10-CM

## 2020-01-22 LAB — CYTOLOGY - PAP
Chlamydia: NEGATIVE
Comment: NEGATIVE
Comment: NORMAL
Diagnosis: NEGATIVE
Neisseria Gonorrhea: NEGATIVE

## 2020-01-22 NOTE — ED Triage Notes (Signed)
Irregular and frequent vaginal bleeding for a year, [facility].   Two nose bleeds this week.   No nasal spray use, no OTC pain med use.   No COVID infection or vaccination recently.

## 2020-01-22 NOTE — Discharge Instructions (Signed)
Please contact [facility] to establish care with a new primary care provider. They will be able to provide you with a referral to a hematologist to continue the work of figuring out your abnormal bleeding, vaginal, nasal or otherwise. Make sure you keep follow up with the gynecologist to discuss your options with them as well.

## 2020-01-22 NOTE — ED Provider Notes (Signed)
  [facility]   MRN: [id number] DOB: 17-Apr-1996  Subjective:   Sandra Vincent is a 24 y.o. female presenting for recheck on irregular persistent vaginal bleeding for the past year.  She had 2 episodes of nosebleeds this past week.  [facility] and had a complete panel of labs.  They recommended oral contraception but she refused at this point.  She is not wanting to do OCPs or hormonal regulation of her vaginal bleeding.  Denies medical history.  Denies any recent Covid exposure or Covid vaccination.  She does not use any kind of nasal sprays.  Has not used any medications for her symptoms.  Does not have a PCP.  No current facility-administered medications for this encounter.  Current Outpatient Medications:  .  valACYclovir (VALTREX) 1000 MG tablet, Take 1,000 mg by mouth daily., Disp: , Rfl:    No Known Allergies  History reviewed. No pertinent past medical history.   History reviewed. No pertinent surgical history.  Family History  Family history unknown: Yes    Social History   Tobacco Use  . Smoking status: Former Games developer  . Smokeless tobacco: Never Used  Substance Use Topics  . Alcohol use: No  . Drug use: No    Types: Marijuana    Comment: occ    ROS   Objective:   Vitals: BP 113/67   Pulse (!) 55   Temp 98.3 F (36.8 C) (Oral)   Resp 16   SpO2 99%   Physical Exam Constitutional:      General: She is not in acute distress.    Appearance: Normal appearance. She is well-developed. She is not ill-appearing, toxic-appearing or diaphoretic.  HENT:     Head: Normocephalic and atraumatic.     Nose: Nose normal.     Mouth/Throat:     Mouth: Mucous membranes are moist.     Pharynx: Oropharynx is clear.  Eyes:     General: No scleral icterus.    Extraocular Movements: Extraocular movements intact.     Pupils: Pupils are equal, round, and reactive to light.  Cardiovascular:     Rate and Rhythm: Normal rate.  Pulmonary:      Effort: Pulmonary effort is normal.  Skin:    General: Skin is warm and dry.  Neurological:     General: No focal deficit present.     Mental Status: She is alert and oriented to person, place, and time.  Psychiatric:        Mood and Affect: Mood normal.        Behavior: Behavior normal.        Thought Content: Thought content normal.        Judgment: Judgment normal.      Assessment and Plan :   PDMP not reviewed this encounter.  1. Vaginal bleeding   2. Epistaxis   3. Elevated factor VIII level   4. Low TSH level     Reviewed labs at length with patient.  She had an elevated factor VIII level, low TSH level and everything else was unremarkable including negative von Willebrand disease.  Recommended follow-up with a new PCP and consideration for referral to hematology., info provided to the patient.  Also recommended follow-up with her gynecologist.   Wallis Bamberg, Cordelia Poche 01/22/20 1245

## 2020-01-27 ENCOUNTER — Ambulatory Visit: Admission: RE | Admit: 2020-01-27 | Payer: Self-pay | Source: Ambulatory Visit

## 2020-02-12 ENCOUNTER — Ambulatory Visit: Payer: Self-pay | Admitting: Obstetrics & Gynecology
